# Patient Record
Sex: Male | Born: 1985 | Race: Black or African American | Hispanic: No | Marital: Single | Smoking: Never smoker
Health system: Southern US, Community
[De-identification: ages and names within clinical notes are randomized; demographics above are authoritative.]

## PROBLEM LIST (undated history)

## (undated) DIAGNOSIS — F909 Attention-deficit hyperactivity disorder, unspecified type: Secondary | ICD-10-CM

## (undated) DIAGNOSIS — M19071 Primary osteoarthritis, right ankle and foot: Secondary | ICD-10-CM

## (undated) DIAGNOSIS — IMO0002 Reserved for concepts with insufficient information to code with codable children: Secondary | ICD-10-CM

## (undated) DIAGNOSIS — R4689 Other symptoms and signs involving appearance and behavior: Secondary | ICD-10-CM

## (undated) DIAGNOSIS — M2140 Flat foot [pes planus] (acquired), unspecified foot: Secondary | ICD-10-CM

## (undated) HISTORY — DX: Other symptoms and signs involving appearance and behavior: R46.89

## (undated) HISTORY — DX: Reserved for concepts with insufficient information to code with codable children: IMO0002

## (undated) HISTORY — PX: OTHER SURGICAL HISTORY: SHX169

## (undated) HISTORY — DX: Attention-deficit hyperactivity disorder, unspecified type: F90.9

---
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
## 2005-01-17 ENCOUNTER — Emergency Department (HOSPITAL_COMMUNITY): Admission: EM | Admit: 2005-01-17 | Discharge: 2005-01-17 | Payer: Self-pay | Admitting: Emergency Medicine

## 2006-07-05 ENCOUNTER — Emergency Department (HOSPITAL_COMMUNITY): Admission: EM | Admit: 2006-07-05 | Discharge: 2006-07-05 | Payer: Self-pay | Admitting: *Deleted

## 2007-05-25 ENCOUNTER — Emergency Department (HOSPITAL_COMMUNITY): Admission: EM | Admit: 2007-05-25 | Discharge: 2007-05-25 | Payer: Self-pay | Admitting: General Surgery

## 2009-02-24 ENCOUNTER — Emergency Department (HOSPITAL_COMMUNITY): Admission: EM | Admit: 2009-02-24 | Discharge: 2009-02-24 | Payer: Self-pay | Admitting: Emergency Medicine

## 2013-11-25 ENCOUNTER — Emergency Department (INDEPENDENT_AMBULATORY_CARE_PROVIDER_SITE_OTHER)
Admission: EM | Admit: 2013-11-25 | Discharge: 2013-11-25 | Disposition: A | Discharge status: Home / Self Care | Payer: Medicaid Other | Source: Home / Self Care | Attending: Family Medicine | Admitting: Family Medicine

## 2013-11-25 ENCOUNTER — Emergency Department (INDEPENDENT_AMBULATORY_CARE_PROVIDER_SITE_OTHER): Payer: Medicaid Other

## 2013-11-25 ENCOUNTER — Encounter (HOSPITAL_COMMUNITY): Payer: Self-pay | Admitting: Emergency Medicine

## 2013-11-25 DIAGNOSIS — M79673 Pain in unspecified foot: Secondary | ICD-10-CM

## 2013-11-25 DIAGNOSIS — M79609 Pain in unspecified limb: Secondary | ICD-10-CM

## 2013-11-25 MED ORDER — DICLOFENAC SODIUM 75 MG PO TBEC: 75.0000 mg | DELAYED_RELEASE_TABLET | Freq: Two times a day (BID) | ORAL | Status: AC | PRN

## 2013-11-25 NOTE — ED Provider Notes (Signed)
Jose Woods is a 28 y.o. male who presents to Urgent Care today for right leg pain. Right leg pain is been present off and on for years. It is worsened over the past 2 days. He notes pain across the dorsal midfoot into the anterior ankle and in the anterior distal knee. Pain is worse with activity and better with rest. He denies any significant weakness numbness or radiating pain. He has a history of a congenital foot abnormality. Him and his mother not quite sure what the problem exactly was but did describe surgery on his Achilles tendon when he was 28 years old to lengthen it. He has had foot problems off and on since then.   History reviewed. No pertinent past medical history. History  Substance Use Topics  . Smoking status: Not on file  . Smokeless tobacco: Not on file  . Alcohol Use: Not on file   ROS as above Medications: No current facility-administered medications for this encounter.   Current Outpatient Prescriptions  Medication Sig Dispense Refill  . diclofenac (VOLTAREN) 75 MG EC tablet Take 1 tablet (75 mg total) by mouth 2 (two) times daily as needed.  60 tablet  0    Exam:  BP 128/68  Pulse 66  Temp(Src) 98.4 F (36.9 C) (Oral)  Resp 14  SpO2 100% Gen: Well NAD Right lower extremity:  Right knee is normal-appearing nontender stable ligamentous exam full range of motion and negative McMurray's test. Strength is intact Right foot: Flat rigid foot nontender. Capillary Refill sensation are intact. Right ankle: Decreased motion nontender. Stable ligament exam.  Right foot is visibly shorter than the left foot.  Leg length: right leg length is approximately 1 cm shorter than the left  No results found for this or any previous visit (from the past 24 hour(s)). Dg Ankle Complete Right  11/25/2013   CLINICAL DATA:  Pain  EXAM: RIGHT ANKLE - COMPLETE 3+ VIEW  COMPARISON:  DG FOOT COMPLETE*R* dated 11/25/2013; DG ANKLE COMPLETE*R* dated 01/17/2005  FINDINGS: There is no  evidence of fracture or dislocation. Severe arthritic changes of the ankle are again appreciated which appears stable. There is subtalar joint arthropathy, multiple areas of hypertrophic bone spurring. Areas of talonavicular and tarsal metatarsal hypertrophic spurring are identified. Stable flatfoot deformity.  IMPRESSION: Chronic arthritic changes without evidence of acute abnormalities. Stable flatfoot deformity.   Electronically Signed   By: Salome HolmesHector  Cooper M.D.   On: 11/25/2013 12:21   Dg Foot Complete Right  11/25/2013   CLINICAL DATA:  Foot pain, no injury  EXAM: RIGHT FOOT COMPLETE - 3+ VIEW  COMPARISON:  None.  FINDINGS: There is no evidence of fracture or dislocation. There is plantar calcaneal spur. There are degenerative joint changes of the first digit with narrowed joint space and osteophyte formation. Degenerative joint changes of the tarsal bones are noted. Soft tissues are unremarkable.  IMPRESSION: No acute fracture or dislocation. Degenerative joint changes of right foot.   Electronically Signed   By: Sherian ReinWei-Chen  Lin M.D.   On: 11/25/2013 12:05    Assessment and Plan: 28 y.o. male with right leg and foot pain. This is secondary to his congenital abnormality. 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 Lahey would benefit from an orthotic. Recommend he return to his primary care provider requests referral to podiatry for orthotic. Diclofenac for pain in the interim. Work note provided.  Discussed warning signs or symptoms. Please see discharge instructions. Patient expresses understanding.    Jose HarrowEvan S  Denyse Amass, MD 11/25/13 1250

## 2013-11-25 NOTE — ED Notes (Signed)
C/o right leg pain  See physician note

## 2013-11-25 NOTE — Discharge Instructions (Signed)
Thank you for coming in today. Take diclofenac twice daily. Followup with your primary care Dr. soon for referral to podiatry.

## 2014-06-30 ENCOUNTER — Ambulatory Visit: Payer: Self-pay | Admitting: Podiatry

## 2014-09-02 DIAGNOSIS — M2140 Flat foot [pes planus] (acquired), unspecified foot: Secondary | ICD-10-CM

## 2014-09-02 DIAGNOSIS — M2170 Unequal limb length (acquired), unspecified site: Secondary | ICD-10-CM

## 2014-09-02 DIAGNOSIS — M19071 Primary osteoarthritis, right ankle and foot: Secondary | ICD-10-CM

## 2014-09-02 HISTORY — DX: Unequal limb length (acquired), unspecified site: M21.70

## 2014-09-02 HISTORY — DX: Primary osteoarthritis, right ankle and foot: M19.071

## 2014-09-02 HISTORY — DX: Flat foot (pes planus) (acquired), unspecified foot: M21.40

## 2015-05-17 ENCOUNTER — Ambulatory Visit (INDEPENDENT_AMBULATORY_CARE_PROVIDER_SITE_OTHER): Payer: Medicaid Other

## 2015-05-17 ENCOUNTER — Ambulatory Visit (INDEPENDENT_AMBULATORY_CARE_PROVIDER_SITE_OTHER): Payer: Medicaid Other | Admitting: Podiatry

## 2015-05-17 ENCOUNTER — Encounter: Payer: Self-pay | Admitting: Podiatry

## 2015-05-17 VITALS — BP 101/86 | HR 72 | Resp 12

## 2015-05-17 DIAGNOSIS — R52 Pain, unspecified: Secondary | ICD-10-CM | POA: Diagnosis not present

## 2015-05-17 DIAGNOSIS — M19071 Primary osteoarthritis, right ankle and foot: Secondary | ICD-10-CM

## 2015-05-17 NOTE — Patient Instructions (Signed)
Today your x-ray examination and clinical examination demonstrated advanced wear and tear arthritis in the right rear foot area which is the primary source of pain. In addition the right leg appears shorter than the left which further aggravates the arthritis in the rear foot The left foot has mild osteoarthritis in the rear foot area An [place] brace which is a custom brace that molds to the lower leg and rear foot could reduce some of the discomfort.

## 2015-05-17 NOTE — Progress Notes (Signed)
Subjective:    Patient ID: Jose Woods, male    DOB: 1986-05-13, 29 y.o.   MRN: [id number]  HPI  PT REQUESTING FOR TOENAILS DEBRIDEMENT. This patient presents today complaining of pain in the dorsal aspect the right foot over a multiple year. The pain has increased in the last 3 months. The symptoms are activated primary with standing walking relieved with rest. Patient has tried wearing good athletic style shoes, however, the pain persisted. The patient's grandparents described surgical treatment to the tendo Achilles area 2 as a child. The patient's grandparents want to know if a shoe insert would be helpful for their grandsons foot pain. Also, there is a history of possible limb shortage in the right lower extremity. Patient is under the care of his grandparents. Also, patient was complaining of uncomfortable elongated toenails and requests toenail debridement.   Review of Systems  Psychiatric/Behavioral: Positive for behavioral problems.  All other systems reviewed and are negative.      Objective:   Physical Exam  Patient is responsive and does answer some questions with the assistance of his grandparents  Objective: Vascular: No peripheral edema noted bilaterally DP and PT pulses 2/4 bilaterally Capillary reflex immediate bilaterally  Neurological: Sensation to 10 g monofilament wire intact 5/5 bilaterally Knee and ankle reflex equal and reactive bilaterally  Dermatological: Texture and turgor within normal limits Well-healed surgical scar posterior tendo Achilles right  Musculoskeletal: Pes planus bilaterally Right calf appears smaller than left Right extremity approximately half inch shorter than left by observation Right subtalar joint has no motion, no restriction left subtalar joint motion Right midtarsal joint restricted, no restriction right midtarsal joint motion Dorsi flexion neutral bilaterally  x-ray examination weightbearing right foot, dated  05/17/2015  Intact bony structure without fracture and/or dislocation Pes planus Posterior and inferior calcaneal spurs Large dorsal exostosis talus at navicular area Deformity anterior ankle associated with the talus exostosis Decrease subtalar joint space Decreased joint space talus navicular Hallux interphalangeus  Radiographic impression: No acute bony abnormality noted Significant advanced osteoarthritis subtalar joint and midtarsal joint, right foot Osteoarthritic changes in the ankle joint  X-ray examination weightbearing left foot, dated 05/17/2015  Intact bony structure without fracture and/or dislocation Pes planus Posterior and inferior calcaneal spurs Small exostosis talus  Radiographic impression: No acute bony abnormality noted in the left foot Pes planus    Assessment & Plan:   Assessment: Limb shortage right lower extremity 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 I made him aware that there was significant osteoarthritic changes in the rear foot and ankle of the right foot which is the reason for right foot pain. I told him at this time because of the advanced osteoarthritis in the rear foot that the options could include a external brace, and [place] type brace to restrict ankle and subtalar joint motion with the benefit of reduction of pain. I made him aware that the osteoarthritis still would be present. If the brace did not work then possible surgical fusion of the ankle and rear foot was a possibility. Also, I would recommend a half to three-quarter inch full heel and sole left with the [place] brace. Patient and grandparents were made aware that Medicaid would not pay for such a brace. Patient's grandfather said that he would self-pay for this brace  Schedule appointment with her orthotist for  consultation and  fabrication of [place] brace, right lower extremity

## 2015-06-14 ENCOUNTER — Ambulatory Visit: Payer: Medicaid Other | Admitting: *Deleted

## 2015-06-14 DIAGNOSIS — M19071 Primary osteoarthritis, right ankle and foot: Secondary | ICD-10-CM

## 2015-06-14 NOTE — Progress Notes (Signed)
Patient ID: Jose Woods, male   DOB: 12/07/1985, 29 y.o.   MRN: [id number] [facility]

## 2015-06-25 IMAGING — CR DG ANKLE COMPLETE 3+V*R*
3 series · 3 of 3 positions shown · non-contrast
Comparison: DG FOOT COMPLETE*R* dated 11/25/2013; DG ANKLE
COMPLETE*R* dated 01/17/2005

CLINICAL DATA: Pain

EXAM:
RIGHT ANKLE - COMPLETE 3+ VIEW

[view not recorded (1 of 3)]
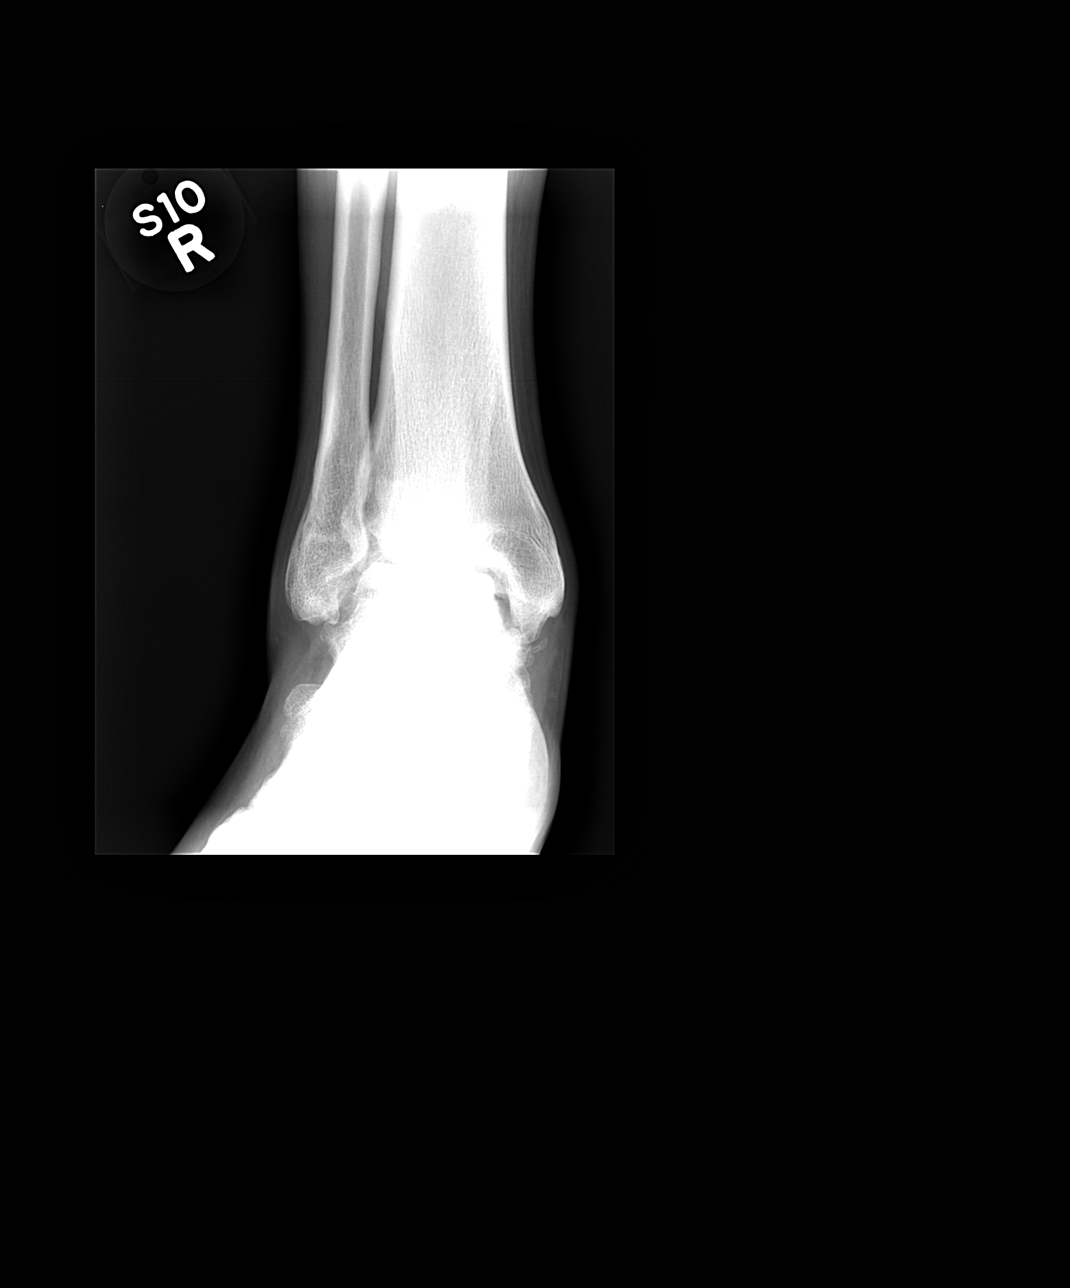

[view not recorded (2 of 3)]
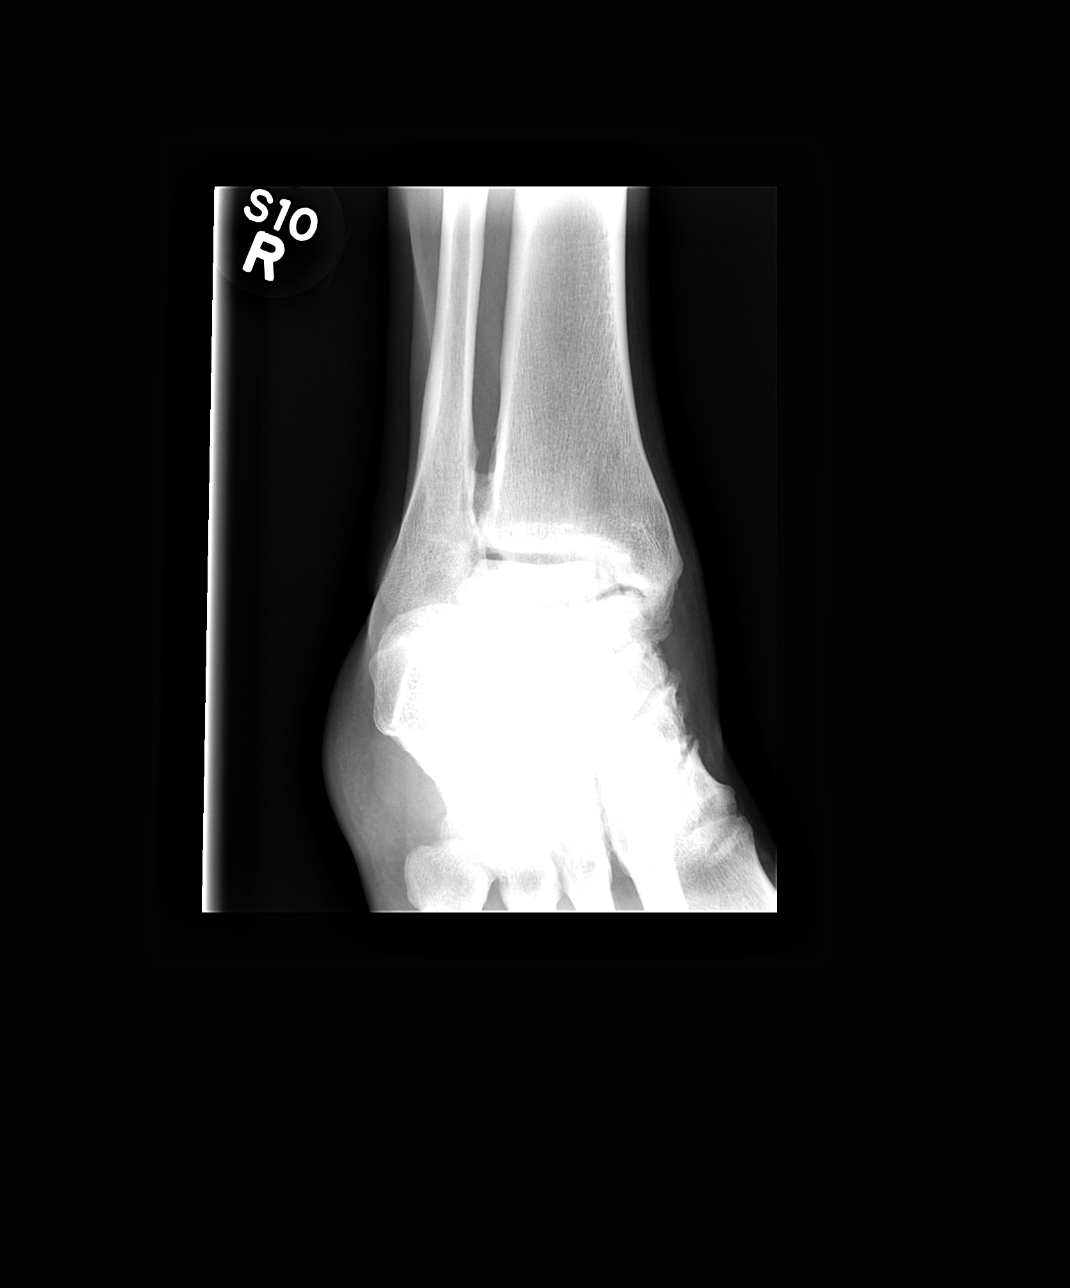

[view not recorded (3 of 3)]
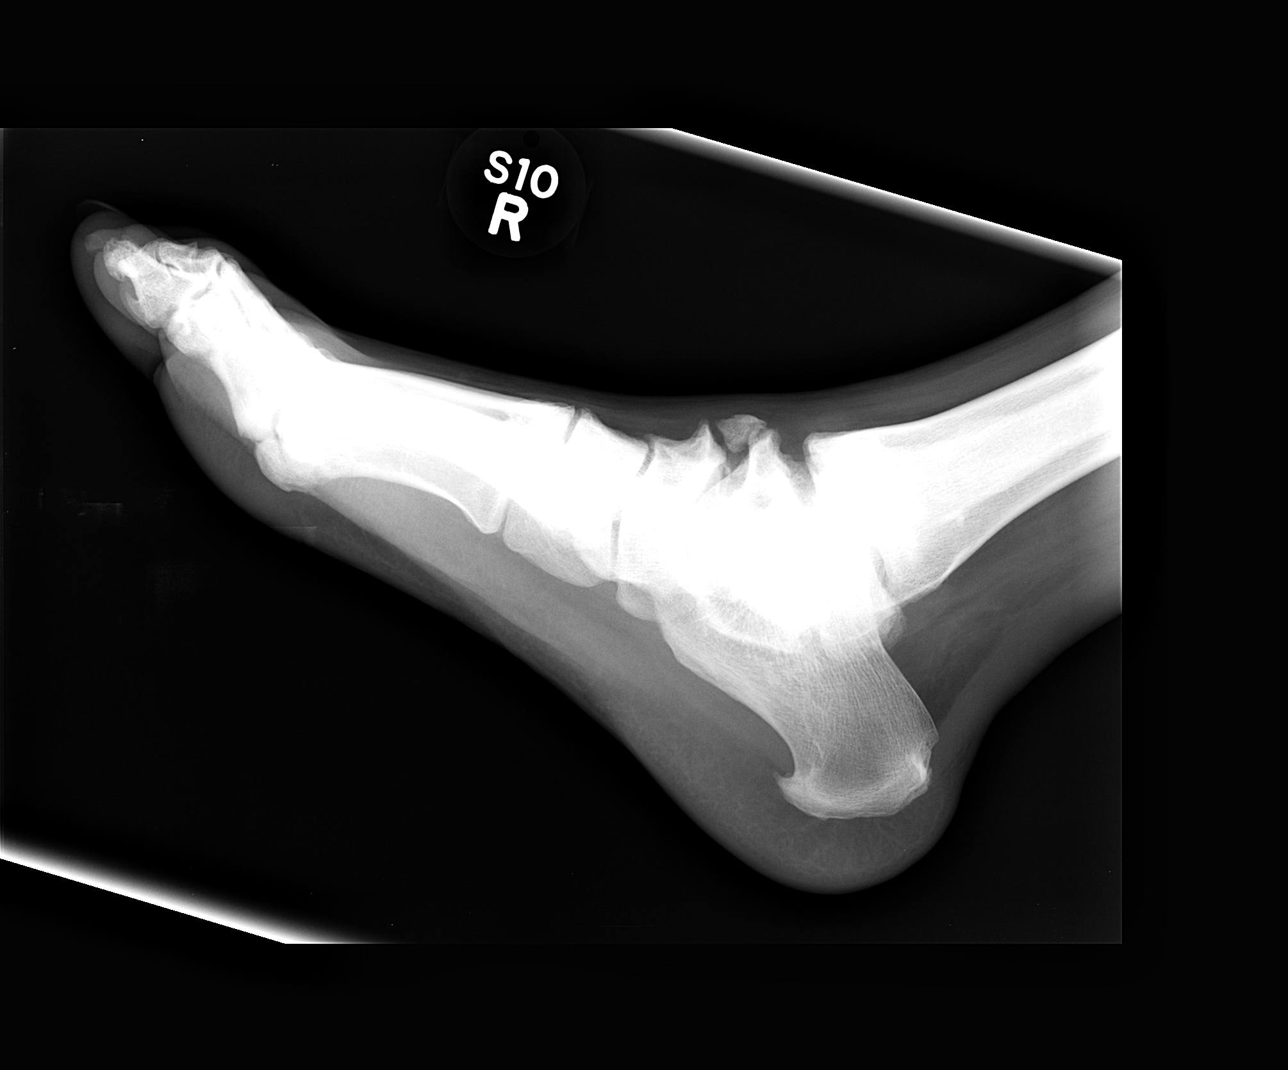

[3 of 3 positions shown; findings below may reference images not displayed]

FINDINGS: There is no evidence of fracture or dislocation. Severe arthritic
changes of the ankle are again appreciated which appears stable.
There is subtalar joint arthropathy, multiple areas of hypertrophic
bone spurring. Areas of talonavicular and tarsal metatarsal
hypertrophic spurring are identified. Stable flatfoot deformity.
IMPRESSION: Chronic arthritic changes without evidence of acute abnormalities.
Stable flatfoot deformity.

## 2015-07-12 ENCOUNTER — Ambulatory Visit (INDEPENDENT_AMBULATORY_CARE_PROVIDER_SITE_OTHER): Payer: Medicaid Other | Admitting: *Deleted

## 2015-07-12 DIAGNOSIS — M19071 Primary osteoarthritis, right ankle and foot: Secondary | ICD-10-CM

## 2015-07-12 NOTE — Progress Notes (Signed)
Patient ID: Jose Woods, male   DOB: 04-Sep-1985, 29 y.o.   MRN: [id number] Patient [facility] of [place] brace [facility]. Written and verbal break in instructions given. Patient will follow up in 6 weeks with Dr. Leeanne Deeduchman.

## 2015-07-22 DIAGNOSIS — M19071 Primary osteoarthritis, right ankle and foot: Secondary | ICD-10-CM

## 2015-07-22 DIAGNOSIS — M79671 Pain in right foot: Secondary | ICD-10-CM

## 2015-08-01 ENCOUNTER — Encounter: Payer: Self-pay | Admitting: Podiatry

## 2015-08-01 ENCOUNTER — Ambulatory Visit (INDEPENDENT_AMBULATORY_CARE_PROVIDER_SITE_OTHER): Payer: Medicaid Other | Admitting: Podiatry

## 2015-08-01 VITALS — BP 152/86 | HR 69 | Resp 12

## 2015-08-01 DIAGNOSIS — M19071 Primary osteoarthritis, right ankle and foot: Secondary | ICD-10-CM | POA: Diagnosis not present

## 2015-08-01 DIAGNOSIS — R52 Pain, unspecified: Secondary | ICD-10-CM

## 2015-08-01 NOTE — Progress Notes (Signed)
   Subjective:    Patient ID: Jose Woods, male    DOB: February 02, 1986, 29 y.o.   MRN: [id number]  HPI This patient presents today with his grandmother for follow-up tolerance of [place] brace worn on the right foot. Patient states that his right foot feels considerably better when he wears [place] brace, however, when he removes the brace she complains of some discomfort in the right foot. He denies any skin sores from the brace and overall has had relief of symptoms   Review of Systems  Musculoskeletal: Positive for joint swelling and gait problem.       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

## 2015-08-01 NOTE — Patient Instructions (Addendum)
Today you stated that the [place] brace worn on the right foot make sure foot feel comfortable when you were the brace and uncomfortable 1 you remove the brace Increase the wearing time even at night and have a slipper that you can cover the brace and when you go to the bathroom Remove the brace at least once a day and massage skin lotion in your feet and inspect for any sign of irritation

## 2015-08-23 ENCOUNTER — Ambulatory Visit (INDEPENDENT_AMBULATORY_CARE_PROVIDER_SITE_OTHER): Payer: Medicaid Other | Admitting: Podiatry

## 2015-08-23 NOTE — Progress Notes (Signed)
Patient ID: Jose Woods, male   DOB: Jan 16, 1986, 29 y.o.   MRN: [id number]    NO SHOW

## 2015-12-15 ENCOUNTER — Emergency Department
Admission: EM | Admit: 2015-12-15 | Discharge: 2015-12-15 | Disposition: A | Discharge status: Home / Self Care | Payer: Medicaid Other | Attending: Emergency Medicine | Admitting: Emergency Medicine

## 2015-12-15 ENCOUNTER — Encounter: Payer: Self-pay | Admitting: Emergency Medicine

## 2015-12-15 DIAGNOSIS — H5711 Ocular pain, right eye: Secondary | ICD-10-CM | POA: Diagnosis present

## 2015-12-15 DIAGNOSIS — F909 Attention-deficit hyperactivity disorder, unspecified type: Secondary | ICD-10-CM | POA: Diagnosis not present

## 2015-12-15 DIAGNOSIS — H18891 Other specified disorders of cornea, right eye: Secondary | ICD-10-CM

## 2015-12-15 DIAGNOSIS — H578 Other specified disorders of eye and adnexa: Secondary | ICD-10-CM | POA: Diagnosis not present

## 2015-12-15 MED ORDER — FLUORESCEIN SODIUM 1 MG OP STRP
1.0000 | ORAL_STRIP | Freq: Once | OPHTHALMIC | Status: DC
  Filled 2015-12-15: qty 1

## 2015-12-15 MED ORDER — TETRACAINE HCL 0.5 % OP SOLN
2.0000 [drp] | Freq: Once | OPHTHALMIC | Status: DC
  Filled 2015-12-15: qty 2

## 2015-12-15 MED ORDER — KETOROLAC TROMETHAMINE 0.5 % OP SOLN: 1.0000 [drp] | Freq: Four times a day (QID) | OPHTHALMIC | Status: AC
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
## 2015-12-15 NOTE — ED Notes (Signed)
Visual acuity screening performed by Susa RaringLisa RN. Screening was normal

## 2015-12-15 NOTE — Discharge Instructions (Signed)
How to Use Eye Drops and Eye Ointments  HOW TO APPLY EYE DROPS  Follow these steps when applying eye drops:  1. Wash your hands.  2. Tilt your head back.  3. Put a finger under your eye and use it to gently pull your lower lid downward. Keep that finger in place.  4. Using your other hand, hold the dropper between your thumb and index finger.  5. Position the dropper just over the edge of the lower lid. Hold it as close to your eye as you can without touching the dropper to your eye.  6. Steady your hand. One way to do this is to lean your index finger against your brow.  7. Look up.  8. Slowly and gently squeeze one drop of medicine into your eye.  9. Close your eye.  10. Place a finger between your lower eyelid and your nose. Press gently for 2 minutes. This increases the amount of time that the medicine is exposed to the eye. It also reduces side effects that can develop if the drop gets into the bloodstream through the nose.  HOW TO APPLY EYE OINTMENTS  Follow these steps when applying eye ointments:  1. Wash your hands.  2. Put a finger under your eye and use it to gently pull your lower lid downward. Keep that finger in place.  3. Using your other hand, place the tip of the tube between your thumb and index finger with the remaining fingers braced against your cheek or nose.  4. Hold the tube just over the edge of your lower lid without touching the tube to your lid or eyeball.  5. Look up.  6. Line the inner part of your lower lid with ointment.  7. Gently pull up on your upper lid and look down. This will force the ointment to spread over the surface of the eye.  8. Release the upper lid.  9. If you can, close your eyes for 1-2 minutes.  Do not rub your eyes. If you applied the ointment correctly, your vision will be blurry for a few minutes. This is normal.  ADDITIONAL INFORMATION   Make sure to use the eye drops or ointment as told by your health care provider.   If you have been told to use both eye  drops and an eye ointment, apply the eye drops first, then wait 3-4 minutes before you apply the ointment.   Try not to touch the tip of the dropper or tube to your eye. A dropper or tube that has touched the eye can become contaminated.     This information is not intended to replace advice given to you by your health care provider. Make sure you discuss any questions you have with your health care provider.     Document Released: 11/25/2000 Document Revised: 01/03/2015 Document Reviewed: 08/15/2014  Elsevier Interactive Patient Education 2016 [employer]

## 2015-12-15 NOTE — ED Notes (Signed)
  Reviewed d/c instructions, follow-up care, and prescriptions with pt. Pt verbalized understanding 

## 2017-03-19 ENCOUNTER — Ambulatory Visit: Payer: Medicaid Other

## 2017-03-19 ENCOUNTER — Ambulatory Visit (INDEPENDENT_AMBULATORY_CARE_PROVIDER_SITE_OTHER): Payer: Medicaid Other | Admitting: Podiatry

## 2017-03-19 DIAGNOSIS — L600 Ingrowing nail: Secondary | ICD-10-CM

## 2017-03-19 NOTE — Patient Instructions (Signed)

## 2017-03-19 NOTE — Progress Notes (Signed)
   Subjective: Patient presents today for evaluation of pain in toe(s). Patient is concerned for possible ingrown nail. Patient states that the pain has been present for a few weeks now. Patient presents today for further treatment and evaluation.  Objective:  General: Well developed, nourished, in no acute distress, alert and oriented x3   Dermatology: Skin is warm, dry and supple bilateral. Lateral border of the right great toe appears to be erythematous with evidence of an ingrowing nail. Pain on palpation noted to the border of the nail fold. The remaining nails appear unremarkable at this time. There are no open sores, lesions.  Vascular: Dorsalis Pedis artery and Posterior Tibial artery pedal pulses palpable. No lower extremity edema noted.   Neruologic: Grossly intact via light touch bilateral.  Musculoskeletal: Muscular strength within normal limits in all groups bilateral. Normal range of motion noted to all pedal and ankle joints.   Assesement: #1 Paronychia with ingrowing nail lateral border right great toe #2 Pain in toe #3 Incurvated nail  Plan of Care:  1. Patient evaluated.  2. Discussed treatment alternatives and plan of care. Explained nail avulsion procedure and post procedure course to patient. 3. Patient opted for permanent partial nail avulsion.  4. Prior to procedure, local anesthesia infiltration utilized using 3 ml of a 50:50 mixture of 2% plain lidocaine and 0.5% plain marcaine in a normal hallux block fashion and a betadine prep performed.  5. Partial permanent nail avulsion with chemical matrixectomy performed using 3x30sec applications of phenol followed by alcohol flush.  6. Light dressing applied. 7. Return to clinic in 2 weeks.   Brent M. Evans, DPM [facility]  Dr. Brent M. Evans, DPM    [address]                                        [place], [place] [address]                Office [phone]  Fax [phone]      

## 2018-02-27 ENCOUNTER — Encounter: Payer: Self-pay | Admitting: Podiatry

## 2018-02-27 ENCOUNTER — Ambulatory Visit: Payer: Medicaid Other | Admitting: Podiatry

## 2018-02-27 DIAGNOSIS — M79674 Pain in right toe(s): Secondary | ICD-10-CM

## 2018-02-27 DIAGNOSIS — M79675 Pain in left toe(s): Secondary | ICD-10-CM

## 2018-02-27 DIAGNOSIS — B351 Tinea unguium: Secondary | ICD-10-CM

## 2018-02-28 NOTE — Progress Notes (Signed)
Subjective: Mr. Jose Woods is a 32 y.o. AAM who presents  today with cc of painful, discolored, thick toenails which interfere with activities of daily living. Pain is aggravated when wearing enclosed shoe gear. Pain is getting progressively worse and relieved with periodic professional debridement.  Objective: There were no vitals filed for this visit. Vascular Examination: Capillary refill time <3 seconds x 10 digits Dorsalis pedis and posterior tibial pulses present b/l Sparse digital hair x 10 digits Skin temperature warm to warm b/l  Dermatological Examination: Skin thin and atrophic b/l Toenails 1-5 b/l discolored, thick, dystrophic with subungual debris and pain with palpation to nailbeds due to thickness of nails.  Musculoskeletal: Muscle strength 5/5 to all LE muscle groups  Neurological: Sensation intact with 10 gram monofilament. Vibratory sensation intact.  Assessment: Painful onychomycosis toenails 1-5 b/l   Plan: 1. Toenails 1-5 b/l were debrided in length and girth without iatrogenic bleeding. 2. Patient to continue soft, supportive shoe gear 3. Patient to report any pedal injuries to medical professional immediately. 4. Follow up 4 months.  5. Patient/POA to call should there be a concern in the interim.

## 2018-06-26 ENCOUNTER — Ambulatory Visit: Payer: Medicaid Other | Admitting: Podiatry

## 2018-09-18 ENCOUNTER — Encounter: Payer: Self-pay | Admitting: Podiatry

## 2018-09-18 ENCOUNTER — Ambulatory Visit (INDEPENDENT_AMBULATORY_CARE_PROVIDER_SITE_OTHER): Payer: Medicaid Other | Admitting: Podiatry

## 2018-09-18 DIAGNOSIS — B351 Tinea unguium: Secondary | ICD-10-CM | POA: Diagnosis not present

## 2018-09-18 DIAGNOSIS — M79675 Pain in left toe(s): Secondary | ICD-10-CM

## 2018-09-18 DIAGNOSIS — M79674 Pain in right toe(s): Secondary | ICD-10-CM

## 2018-09-18 NOTE — Patient Instructions (Signed)
BRING IN AFO!!!!!! Onychomycosis/Fungal Toenails  WHAT IS IT? An infection that lies within the keratin of your nail plate that is caused by a fungus.  WHY ME? Fungal infections affect all ages, sexes, races, and creeds.  There may be many factors that predispose you to a fungal infection such as age, coexisting medical conditions such as diabetes, or an autoimmune disease; stress, medications, fatigue, genetics, etc.  Bottom line: fungus thrives in a warm, moist environment and your shoes offer such a location.  IS IT CONTAGIOUS? Theoretically, yes.  You do not want to share shoes, nail clippers or files with someone who has fungal toenails.  Walking around barefoot in the same room or sleeping in the same bed is unlikely to transfer the organism.  It is important to realize, however, that fungus can spread easily from one nail to the next on the same foot.  HOW DO WE TREAT THIS?  There are several ways to treat this condition.  Treatment may depend on many factors such as age, medications, pregnancy, liver and kidney conditions, etc.  It is best to ask your doctor which options are available to you.  1. No treatment.   Unlike many other medical concerns, you can live with this condition.  However for many people this can be a painful condition and may lead to ingrown toenails or a bacterial infection.  It is recommended that you keep the nails cut short to help reduce the amount of fungal nail. 2. Topical treatment.  These range from herbal remedies to prescription strength nail lacquers.  About 40-50% effective, topicals require twice daily application for approximately 9 to 12 months or until an entirely new nail has grown out.  The most effective topicals are medical grade medications available through physicians offices. 3. Oral antifungal medications.  With an 80-90% cure rate, the most common oral medication requires 3 to 4 months of therapy and stays in your system for a year as the new  nail grows out.  Oral antifungal medications do require blood work to make sure it is a safe drug for you.  A liver function panel will be performed prior to starting the medication and after the first month of treatment.  It is important to have the blood work performed to avoid any harmful side effects.  In general, this medication safe but blood work is required. 4. Laser Therapy.  This treatment is performed by applying a specialized laser to the affected nail plate.  This therapy is noninvasive, fast, and non-painful.  It is not covered by insurance and is therefore, out of pocket.  The results have been very good with a 80-95% cure rate.  The [facility] is the only practice in the area to offer this therapy. 5. Permanent Nail Avulsion.  Removing the entire nail so that a new nail will not grow back.

## 2018-10-04 ENCOUNTER — Encounter: Payer: Self-pay | Admitting: Podiatry

## 2018-10-04 NOTE — Progress Notes (Signed)
Subjective: Jose Woods presents today with painful, thick toenails 1-5 b/l that he cannot cut and which interfere with daily activities.  Pain is aggravated when wearing enclosed shoe gear.  His grandmother is present during the visit. Jose Woods states he needs his AFO assessed. He does not have it with him on today.  [name], MD is his PCP.   Current Outpatient Medications:  .  Amphetamine-Dextroamphetamine (ADDERALL PO), Take by mouth., Disp: , Rfl:  .  diclofenac (VOLTAREN) 75 MG EC tablet, Take 1 tablet (75 mg total) by mouth 2 (two) times daily as needed., Disp: 60 tablet, Rfl: 0 .  ketorolac (ACULAR) 0.5 % ophthalmic solution, Place 1 drop into the right eye 4 (four) times daily., Disp: 5 mL, Rfl: 0  No Known Allergies  Objective:  Vascular Examination: Capillary refill time <3 seconds x 10 digits  Dorsalis pedis and Posterior tibial pulses palpable b/l  Digital hair present x 10 digits  Skin temperature gradient WNL b/l  Dermatological Examination: Skin thin and atrophic b/l  Toenails 1-5 b/l discolored, thick, dystrophic with subungual debris and pain with palpation to nailbeds due to thickness of nails.  Musculoskeletal: Muscle strength 5/5 to all LE muscle groups  No gross bony deformities b/l.  No pain, crepitus or joint limitation noted with ROM.   Neurological: Sensation intact with 10 gram monofilament. Vibratory sensation intact.  Assessment: Painful onychomycosis toenails 1-5 b/l   Plan: 1. Toenails 1-5 b/l were debrided in length and girth without iatrogenic bleeding. 2. Patient to continue soft, supportive shoe gear 3. Patient to report any pedal injuries to medical professional immediately. 4. Follow up 3 months. Patient/POA to call should there be a concern in the interim.

## 2018-11-02 ENCOUNTER — Encounter: Payer: Self-pay | Admitting: Podiatry

## 2018-12-18 ENCOUNTER — Ambulatory Visit: Payer: Medicaid Other | Admitting: Podiatry

## 2019-01-20 ENCOUNTER — Ambulatory Visit: Payer: Medicaid Other | Admitting: Podiatry

## 2019-01-22 ENCOUNTER — Encounter: Payer: Self-pay | Admitting: Podiatry

## 2019-01-22 ENCOUNTER — Ambulatory Visit: Payer: Medicaid Other | Admitting: Podiatry

## 2019-01-22 ENCOUNTER — Other Ambulatory Visit: Payer: Self-pay

## 2019-01-22 VITALS — Temp 97.9°F

## 2019-01-22 DIAGNOSIS — M79674 Pain in right toe(s): Secondary | ICD-10-CM | POA: Diagnosis not present

## 2019-01-22 DIAGNOSIS — B351 Tinea unguium: Secondary | ICD-10-CM

## 2019-01-22 DIAGNOSIS — M19071 Primary osteoarthritis, right ankle and foot: Secondary | ICD-10-CM

## 2019-01-22 DIAGNOSIS — M2142 Flat foot [pes planus] (acquired), left foot: Secondary | ICD-10-CM

## 2019-01-22 DIAGNOSIS — M79675 Pain in left toe(s): Secondary | ICD-10-CM

## 2019-01-22 DIAGNOSIS — M2141 Flat foot [pes planus] (acquired), right foot: Secondary | ICD-10-CM

## 2019-01-22 DIAGNOSIS — M2170 Unequal limb length (acquired), unspecified site: Secondary | ICD-10-CM

## 2019-01-22 NOTE — Patient Instructions (Signed)

## 2019-01-31 ENCOUNTER — Encounter: Payer: Self-pay | Admitting: Podiatry

## 2019-01-31 DIAGNOSIS — M19071 Primary osteoarthritis, right ankle and foot: Secondary | ICD-10-CM | POA: Insufficient documentation

## 2019-01-31 DIAGNOSIS — B351 Tinea unguium: Secondary | ICD-10-CM | POA: Insufficient documentation

## 2019-01-31 DIAGNOSIS — M79675 Pain in left toe(s): Secondary | ICD-10-CM | POA: Insufficient documentation

## 2019-01-31 DIAGNOSIS — M2170 Unequal limb length (acquired), unspecified site: Secondary | ICD-10-CM | POA: Insufficient documentation

## 2019-01-31 DIAGNOSIS — M2140 Flat foot [pes planus] (acquired), unspecified foot: Secondary | ICD-10-CM | POA: Insufficient documentation

## 2019-01-31 NOTE — Progress Notes (Signed)
Subjective:  Jose Woods presents to clinic today with cc of  painful, thick, discolored, elongated toenails 1-5 b/l that become tender and cannot cut because of thickness.  Patient's grandmother is his caregiver.  He has h/o surgery to Achilles tendon twice as a [place].  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  He has an [place] which is   Pain is aggravated when wearing enclosed shoe gear.  Jose Contras, MD is his PCP.    Current Outpatient Medications:  .  Amphetamine-Dextroamphetamine (ADDERALL PO), Take by mouth., Disp: , Rfl:  .  diclofenac (VOLTAREN) 75 MG EC tablet, Take 1 tablet (75 mg total) by mouth 2 (two) times daily as needed., Disp: 60 tablet, Rfl: 0 .  ketorolac (ACULAR) 0.5 % ophthalmic solution, Place 1 drop into the right eye 4 (four) times daily., Disp: 5 mL, Rfl: 0   No Known Allergies   Objective: Vitals:   01/22/19 0745  Temp: 97.9 F (36.6 C)    Physical Examination:  Vascular Examination: Capillary refill time <3 seconds x 10 digits.  Palpable DP/PT pulses b/l.  Digital hair present b/l.  No edema noted b/l.  Skin temperature gradient WNL b/l.  Dermatological Examination: Skin thin and atrophic b/l.  No open wounds b/l.  No interdigital macerations noted b/l.  Elongated, thick, discolored brittle toenails with subungual debris and pain on dorsal palpation of nailbeds 1-5 b/l.  Musculoskeletal Examination: Muscle strength 5/5 to all muscle groups b/l.  Pes planus foot deformity b/l.  Muscle wasting right calf.  RLE 1/2 inch shorter than LLE  STJ with no motion RLE. Normal ROM LLE.  MTJ restricted RLE and WNL LLE.  Hammertoes RLE  Neurological Examination: Sensation intact 5/5 b/l with 10 gram monofilament.  Vibratory sensation intact b/l.  Proprioceptive sensation intact b/l.  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 Toenails 1-5 b/l were debrided in length and girth without iatrogenic laceration. 2. Rx written for [facility] for new [facility] for diagnoses of pes planus, LLD right LE (shorter), and OA of right foot/ankle. 3. Continue soft, supportive shoe gear daily. 4. Report any pedal injuries to medical professional. 5. Follow up 3 months. 6. Patient/POA to call should there be a question/concern in there interim.

## 2019-02-12 ENCOUNTER — Telehealth: Payer: Self-pay | Admitting: Podiatry

## 2019-02-12 NOTE — Telephone Encounter (Signed)
Shameeka with [facility] called. The fax sent over on Mr. Heindl, she needs the sheet that at the top says [place] for prior approval to be re faxed as it came through distorted. Can be faxed to her at [phone].

## 2019-02-15 NOTE — Telephone Encounter (Signed)
Faxed note informing [place] the [place] form was received in our office distorted and if fax again to our office clearly, I would have it signed.

## 2019-04-26 ENCOUNTER — Encounter: Payer: Self-pay | Admitting: Podiatry

## 2019-04-26 ENCOUNTER — Other Ambulatory Visit: Payer: Self-pay

## 2019-04-26 ENCOUNTER — Ambulatory Visit (INDEPENDENT_AMBULATORY_CARE_PROVIDER_SITE_OTHER): Payer: Medicaid Other | Admitting: Podiatry

## 2019-04-26 VITALS — Temp 98.0°F

## 2019-04-26 DIAGNOSIS — M79674 Pain in right toe(s): Secondary | ICD-10-CM | POA: Diagnosis not present

## 2019-04-26 DIAGNOSIS — M79675 Pain in left toe(s): Secondary | ICD-10-CM

## 2019-04-26 DIAGNOSIS — B351 Tinea unguium: Secondary | ICD-10-CM | POA: Diagnosis not present

## 2019-04-26 NOTE — Patient Instructions (Signed)

## 2019-05-03 NOTE — Progress Notes (Signed)
Subjective:  Jose Woods presents to clinic today with cc of  painful, thick, discolored, elongated toenails 1-5 b/l that become tender and cannot cut because of thickness.  Pain is aggravated when wearing enclosed shoe gear.  Patient states that he received his new brace from [place] [facility].   Current Outpatient Medications:  .  Amphetamine-Dextroamphetamine (ADDERALL PO), Take by mouth., Disp: , Rfl:  .  diclofenac (VOLTAREN) 75 MG EC tablet, Take 1 tablet (75 mg total) by mouth 2 (two) times daily as needed., Disp: 60 tablet, Rfl: 0 .  ketorolac (ACULAR) 0.5 % ophthalmic solution, Place 1 drop into the right eye 4 (four) times daily., Disp: 5 mL, Rfl: 0   No Known Allergies   Objective: Vitals:   04/26/19 0919  Temp: 46 F (36.7 C)    Physical Examination:  Vascular Examination: Capillary refill time less than 3 seconds x 10 digits.    Palpable DP/PT pulses b/l.  Digital hair present b/l.  No edema noted b/l.  Skin temperature gradient WNL b/l.  Dermatological Examination: Skin thin and atrophic bilaterally. No open wounds b/l.  No interdigital macerations noted b/l.  Elongated, thick, discolored brittle toenails with subungual debris and pain on dorsal palpation of nailbeds 1-5 b/l.  Musculoskeletal Examination: Muscle strength 5/5 to all muscle groups b/l.  Pes planus foot deformity bilaterally.  Muscle wasting right calf.  Limb length discrepancy right lower extremity 1/2 inch shorter than the left lower extremity.  Rigid subtalar joint right lower extremity.  Rigid midtarsal joint right lower extremity.  Hammertoes noted right lower extremity. No pain, crepitus or joint discomfort with active/passive ROM.  Neurological Examination: Sensation intact 5/5 b/l with 10 gram monofilament.  Vibratory sensation intact b/l.  Proprioceptive sensation intact b/l.  Assessment: Mycotic nail infection with pain 1-5 b/l  Plan: 1. Toenails 1-5 b/l were  debrided in length and girth without iatrogenic laceration. 2.  Continue soft, supportive shoe gear daily. 3.  Report any pedal injuries to medical professional. 4.  Follow up 3 months. 5.  Patient/POA to call should there be a question/concern in there interim.

## 2019-07-24 ENCOUNTER — Ambulatory Visit (HOSPITAL_COMMUNITY)
Admission: EM | Admit: 2019-07-24 | Discharge: 2019-07-24 | Disposition: A | Discharge status: Home / Self Care | Payer: Medicaid Other | Attending: Family Medicine | Admitting: Family Medicine

## 2019-07-24 ENCOUNTER — Encounter (HOSPITAL_COMMUNITY): Payer: Self-pay | Admitting: Emergency Medicine

## 2019-07-24 ENCOUNTER — Ambulatory Visit (INDEPENDENT_AMBULATORY_CARE_PROVIDER_SITE_OTHER): Payer: Medicaid Other

## 2019-07-24 ENCOUNTER — Ambulatory Visit (HOSPITAL_COMMUNITY): Payer: Medicaid Other

## 2019-07-24 ENCOUNTER — Other Ambulatory Visit: Payer: Self-pay

## 2019-07-24 DIAGNOSIS — L03012 Cellulitis of left finger: Secondary | ICD-10-CM

## 2019-07-24 MED ORDER — LIDOCAINE HCL (PF) 2 % IJ SOLN
INTRAMUSCULAR | Status: AC
  Filled 2019-07-24: qty 5

## 2019-07-24 MED ORDER — CEPHALEXIN 500 MG PO CAPS: 500.0000 mg | ORAL_CAPSULE | Freq: Two times a day (BID) | ORAL | 0 refills | Status: DC

## 2019-07-24 NOTE — ED Triage Notes (Signed)
Patient reports dropping something on left thumb, 2 days ago.  Now, left thumb is painful, swollen.  Redness present around nailbed.

## 2019-07-24 NOTE — Discharge Instructions (Signed)
Please keep the area clean  Please follow up if your symptoms don't improve

## 2019-07-24 NOTE — ED Provider Notes (Signed)
[facility]    CSN: [id number] Arrival date & time: 07/24/19  1004      History   Chief Complaint Chief Complaint  Patient presents with  . Finger Injury    HPI Jose Woods is a 33 y.o. male.   He is presenting with a 2-day history of this left thumb pain.  The pain is occurring over the distal left thumb.  It is occurring near the nailbed.  There is some swelling and throbbing in this area.  There is some redness.  He denies smashing it.  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  Has not taken or done anything for it.  It seems to be constant and severe.  HPI  Past Medical History:  Diagnosis Date  . ADHD (attention deficit hyperactivity disorder)   . Arthritis of right subtalar joint 2016  . Behavior problem   . Flat foot 2016  . Lower limb length difference 2016    Patient Active Problem List   Diagnosis Date Noted  . Lower limb length difference 01/31/2019  . Arthritis of right subtalar joint 01/31/2019  . Flat foot 01/31/2019  . Pain due to onychomycosis of toenails of both feet 01/31/2019    Past Surgical History:  Procedure Laterality Date  . RT FOOT SURGERY         Home Medications    Prior to Admission medications   Medication Sig Start Date End Date Taking? Authorizing Provider  Amphetamine-Dextroamphetamine (ADDERALL PO) Take by mouth.    [provider]  cephALEXin (KEFLEX) 500 MG capsule Take 1 capsule (500 mg total) by mouth 2 (two) times daily. 07/24/19   Myra Rude, MD  diclofenac (VOLTAREN) 75 MG EC tablet Take 1 tablet (75 mg total) by mouth 2 (two) times daily as needed. 11/25/13   Rodolph Bong, MD  ketorolac (ACULAR) 0.5 % ophthalmic solution Place 1 drop into the right eye 4 (four) times daily. 12/15/15   Cuthriell, Delorise Royals, PA-C    Family History History reviewed. No pertinent family history.  Social History Social History   Tobacco Use  . Smoking status:  Never Smoker  . Smokeless tobacco: Never Used  Substance Use Topics  . Alcohol use: No  . Drug use: No     Allergies   Patient has no known allergies.   Review of Systems Review of Systems  Constitutional: Negative for fever.  HENT: Negative for congestion.   Respiratory: Negative for cough.   Cardiovascular: Negative for chest pain.  Gastrointestinal: Negative for abdominal pain.  Musculoskeletal: Negative for back pain.  Skin: Positive for color change.  Neurological: Negative for weakness.  Hematological: Negative for adenopathy.     Physical Exam Triage Vital Signs ED Triage Vitals [07/24/19 1027]  Enc Vitals Group     BP      Pulse      Resp      Temp      Temp src      SpO2      Weight      Height      Head Circumference      Peak Flow      Pain Score 10     Pain Loc      Pain Edu?      Excl. in GC?    No data found.  Updated Vital Signs BP (!) 141/72 (BP Location: Right Arm)   Pulse 63  Temp 98.4 F (36.9 C) (Oral)   Resp 18   SpO2 97%   Visual Acuity Right Eye Distance:   Left Eye Distance:   Bilateral Distance:    Right Eye Near:   Left Eye Near:    Bilateral Near:     Physical Exam Gen: NAD, alert, cooperative with exam, well-appearing ENT: normal lips, normal nasal mucosa,  Eye: normal EOM, normal conjunctiva and lids CV:  no edema, +2 pedal pulses   Resp: no accessory muscle use, non-labored,  GI: no masses or tenderness, no hernia  Skin: no rashes, no areas of induration  Neuro: normal tone, normal sensation to touch Psych:  normal insight, alert and oriented MSK:  Left thumb: Fluctuance appreciated near the nailbed. Normal thumb flexion extension. Some redness over this area of fluctuance. Normal wrist range of motion. No streaking. Neurovascularly intact   UC Treatments / Results  Labs (all labs ordered are listed, but only abnormal results are displayed) Labs Reviewed  AEROBIC CULTURE (SUPERFICIAL SPECIMEN)     EKG   Radiology Dg Finger Thumb Left  Result Date: 07/24/2019 CLINICAL DATA:  Crush injury.  Pain and swelling. EXAM: LEFT THUMB 2+V COMPARISON:  None. FINDINGS: There is no evidence of fracture or dislocation. There is no evidence of arthropathy or other focal bone abnormality. Soft tissue swelling noted in the thumb. IMPRESSION: No acute bony abnormality. Electronically Signed   By: Misty Stanley M.D.   On: 07/24/2019 11:18    Procedures Procedures (including critical care time)  Incision and Drainage Procedure Note:  The affected area was cleaned and draped in a sterile fashion. Anesthesia was achieved using 8 mL of 2% Lidocaine without epinephrine injected around the wound area using a 25-guage 1.5 inch needle. An 11-blade scalpel was used to incise the wound. A culture was obtained.  A sterile dressing was applied to the area. The patient tolerated the procedure well. No complications were encountered.    Medications Ordered in UC Medications  lidocaine (XYLOCAINE) 2 % injection (has no administration in time range)    Initial Impression / Assessment and Plan / UC Course  I have reviewed the triage vital signs and the nursing notes.  Pertinent labs & imaging results that were available during my care of the patient were reviewed by me and considered in my medical decision making (see chart for details).     Roderic Palau is a 33 year old male is presenting with left thumb pain.  Symptoms consistent with paronychia.  X-ray was not demonstrating a foreign body.  The area was incised and drained.  A wound culture was obtained.  He was also provided Keflex.  Counseled on supportive care.  Give indications to return.  Final Clinical Impressions(s) / UC Diagnoses   Final diagnoses:  Paronychia of left thumb     Discharge Instructions     Please keep the area clean  Please follow up if your symptoms don't improve     ED Prescriptions    Medication Sig Dispense Auth. Provider    cephALEXin (KEFLEX) 500 MG capsule Take 1 capsule (500 mg total) by mouth 2 (two) times daily. 14 capsule Rosemarie Ax, MD     PDMP not reviewed this encounter.   Rosemarie Ax, MD 07/24/19 [phone]

## 2019-07-26 ENCOUNTER — Encounter: Payer: Self-pay | Admitting: Podiatry

## 2019-07-26 ENCOUNTER — Other Ambulatory Visit: Payer: Self-pay

## 2019-07-26 ENCOUNTER — Ambulatory Visit (INDEPENDENT_AMBULATORY_CARE_PROVIDER_SITE_OTHER): Payer: Medicaid Other | Admitting: Podiatry

## 2019-07-26 ENCOUNTER — Telehealth (HOSPITAL_COMMUNITY): Payer: Self-pay | Admitting: Emergency Medicine

## 2019-07-26 DIAGNOSIS — M79675 Pain in left toe(s): Secondary | ICD-10-CM | POA: Diagnosis not present

## 2019-07-26 DIAGNOSIS — B351 Tinea unguium: Secondary | ICD-10-CM | POA: Diagnosis not present

## 2019-07-26 DIAGNOSIS — M79674 Pain in right toe(s): Secondary | ICD-10-CM

## 2019-07-26 MED ORDER — CEPHALEXIN 500 MG PO CAPS: 500.0000 mg | ORAL_CAPSULE | Freq: Four times a day (QID) | ORAL | 0 refills | Status: AC

## 2019-07-26 NOTE — Telephone Encounter (Signed)
Lost Rx from visit, refilling Keflex

## 2019-07-26 NOTE — Progress Notes (Signed)
Subjective: Jose Woods is seen today for follow up painful, elongated, thickened toenails 1-5 b/l feet that he cannot cut. Pain interferes with daily activities. Aggravating factor includes wearing enclosed shoe gear and relieved with periodic debridement.  Current Outpatient Medications on File Prior to Visit  Medication Sig  . Amphetamine-Dextroamphetamine (ADDERALL PO) Take by mouth.  . cephALEXin (KEFLEX) 500 MG capsule Take 1 capsule (500 mg total) by mouth 2 (two) times daily.  . diclofenac (VOLTAREN) 75 MG EC tablet Take 1 tablet (75 mg total) by mouth 2 (two) times daily as needed.  Marland Kitchen ketorolac (ACULAR) 0.5 % ophthalmic solution Place 1 drop into the right eye 4 (four) times daily.  . sertraline (ZOLOFT) 100 MG tablet Take 100 mg by mouth daily.   No current facility-administered medications on file prior to visit.      No Known Allergies   Objective:  Vascular Examination: Capillary refill time <3 seconds b/l.  Dorsalis pedis present b/l.  Posterior tibial pulses present b/l.  Digital hair present x 10 digits.  Skin temperature gradient WNL b/l.   Dermatological Examination: Skin thin and atrophic b/l.  Toenails 1-5 b/l discolored, thick, dystrophic with subungual debris and pain with palpation to nailbeds due to thickness of nails.  Musculoskeletal: Muscle strength 5/5 to all LE muscle groups.  Pes planus foot deformity b/l.   Limb length discrepancy RLE 1/2 inch shorter than LLE.  Rigid subtalar joint right lower extremity. Rigid midtarsal joint right lower extremity.   Hammertoes noted right lower extremity.   No pain, crepitus or joint limitation noted with ROM.   Neurological Examination: Protective sensation intact with 10 gram monofilament bilaterally.  Epicritic sensation present bilaterally.  Vibratory sensation intact bilaterally.   Assessment: Painful onychomycosis toenails 1-5 b/l   Plan: 1. Toenails 1-5 b/l were debrided in  length and girth without iatrogenic bleeding. 2. Patient to continue soft, supportive shoe gear. 3. Patient to report any pedal injuries to medical professional immediately. 4. Follow up 3 months.  5. Patient/POA to call should there be a concern in the interim.

## 2019-07-27 LAB — AEROBIC CULTURE  (SUPERFICIAL SPECIMEN)

## 2019-07-27 LAB — AEROBIC CULTURE W GRAM STAIN (SUPERFICIAL SPECIMEN)

## 2019-07-27 LAB — AEROBIC CULTURE? (SUPERFICIAL SPECIMEN)

## 2019-07-28 ENCOUNTER — Telehealth: Payer: Self-pay | Admitting: Family Medicine

## 2019-07-28 NOTE — Telephone Encounter (Signed)
Informed of results.   Rosemarie Ax, MD [facility] 07/28/2019, 10:04 AM

## 2019-10-26 ENCOUNTER — Ambulatory Visit: Payer: Medicaid Other | Admitting: Podiatry

## 2020-02-09 ENCOUNTER — Other Ambulatory Visit: Payer: Self-pay

## 2020-02-09 DIAGNOSIS — M79674 Pain in right toe(s): Secondary | ICD-10-CM | POA: Insufficient documentation

## 2020-02-09 DIAGNOSIS — Z5321 Procedure and treatment not carried out due to patient leaving prior to being seen by health care provider: Secondary | ICD-10-CM | POA: Diagnosis not present

## 2020-02-09 NOTE — ED Triage Notes (Signed)
Pt in with co right great toe pain states thinks it may be ingrown toe nail. Pt states started a few months ago and now swelling and pain to toe has become worse.

## 2020-02-10 ENCOUNTER — Emergency Department
Admission: EM | Admit: 2020-02-10 | Discharge: 2020-02-10 | Discharge status: Against Medical Advice | Payer: Medicaid Other | Attending: Emergency Medicine | Admitting: Emergency Medicine

## 2020-02-10 ENCOUNTER — Ambulatory Visit (HOSPITAL_COMMUNITY)
Admission: EM | Admit: 2020-02-10 | Discharge: 2020-02-10 | Disposition: A | Discharge status: Home / Self Care | Payer: Medicaid Other

## 2020-02-10 ENCOUNTER — Encounter (HOSPITAL_COMMUNITY): Payer: Self-pay | Admitting: Emergency Medicine

## 2020-02-10 ENCOUNTER — Ambulatory Visit: Payer: Medicaid Other | Admitting: Podiatry

## 2020-02-10 DIAGNOSIS — L600 Ingrowing nail: Secondary | ICD-10-CM | POA: Diagnosis not present

## 2020-02-10 DIAGNOSIS — B351 Tinea unguium: Secondary | ICD-10-CM

## 2020-02-10 DIAGNOSIS — M79674 Pain in right toe(s): Secondary | ICD-10-CM

## 2020-02-10 DIAGNOSIS — M79675 Pain in left toe(s): Secondary | ICD-10-CM | POA: Diagnosis not present

## 2020-02-10 MED ORDER — CEPHALEXIN 500 MG PO CAPS: 500.0000 mg | ORAL_CAPSULE | Freq: Three times a day (TID) | ORAL | 0 refills | Status: AC

## 2020-02-10 NOTE — ED Notes (Signed)
No answer when called several times from lobby 

## 2020-02-10 NOTE — Patient Instructions (Signed)

## 2020-02-10 NOTE — Discharge Instructions (Addendum)
Follow up with [facility] for further management. Call them today  [phone]

## 2020-02-10 NOTE — ED Provider Notes (Signed)
[facility]    CSN: [id number] Arrival date & time: 02/10/20  1914      History   Chief Complaint Chief Complaint  Patient presents with  . Toe Pain    HPI Jose Woods is a 34 y.o. male.   Patient is a 34 year old male who presents today with left great toe pain.  This is been constant worsening over the past 4 days.  Patient with history of onychomycosis and sees podiatry regularly for management of his toenails.  He does have been very long he has been unable to cut them.  Pain is worse when he wears his shoes and socks.  No injuries to the foot.  No open wounds or drainage.  No history of diabetes.  ROS per HPI      Past Medical History:  Diagnosis Date  . ADHD (attention deficit hyperactivity disorder)   . Arthritis of right subtalar joint 2016  . Behavior problem   . Flat foot 2016  . Lower limb length difference 2016    Patient Active Problem List   Diagnosis Date Noted  . Lower limb length difference 01/31/2019  . Arthritis of right subtalar joint 01/31/2019  . Flat foot 01/31/2019  . Pain due to onychomycosis of toenails of both feet 01/31/2019    Past Surgical History:  Procedure Laterality Date  . RT FOOT SURGERY         Home Medications    Prior to Admission medications   Medication Sig Start Date End Date Taking? Authorizing Provider  Amphetamine-Dextroamphetamine (ADDERALL PO) Take by mouth.   Yes [provider]  diclofenac (VOLTAREN) 75 MG EC tablet Take 1 tablet (75 mg total) by mouth 2 (two) times daily as needed. 11/25/13  Yes Rodolph Bong, MD  ketorolac (ACULAR) 0.5 % ophthalmic solution Place 1 drop into the right eye 4 (four) times daily. 12/15/15  Yes Cuthriell, Delorise Royals, PA-C  sertraline (ZOLOFT) 100 MG tablet Take 100 mg by mouth daily. 04/30/19  Yes [provider]  cephALEXin (KEFLEX) 500 MG capsule Take 1 capsule (500 mg total) by mouth 3 (three) times daily. 02/10/20   Vivi Barrack, DPM      Family History History reviewed. No pertinent family history.  Social History Social History   Tobacco Use  . Smoking status: Never Smoker  . Smokeless tobacco: Never Used  Substance Use Topics  . Alcohol use: No  . Drug use: No     Allergies   Patient has no known allergies.   Review of Systems Review of Systems   Physical Exam Triage Vital Signs ED Triage Vitals  Enc Vitals Group     BP 02/10/20 0829 130/79     Pulse Rate 02/10/20 0829 88     Resp 02/10/20 0829 16     Temp 02/10/20 0829 98.4 F (36.9 C)     Temp Source 02/10/20 0829 Oral     SpO2 02/10/20 0829 100 %     Weight --      Height --      Head Circumference --      Peak Flow --      Pain Score 02/10/20 0839 10     Pain Loc --      Pain Edu? --      Excl. in GC? --    No data found.  Updated Vital Signs BP 130/79 (BP Location: Left Arm)   Pulse 88   Temp 98.4 F (36.9 C) (  Oral)   Resp 16   SpO2 100%   Visual Acuity Right Eye Distance:   Left Eye Distance:   Bilateral Distance:    Right Eye Near:   Left Eye Near:    Bilateral Near:     Physical Exam Vitals and nursing note reviewed.  Constitutional:      Appearance: Normal appearance.  HENT:     Head: Normocephalic and atraumatic.     Nose: Nose normal.  Eyes:     Conjunctiva/sclera: Conjunctivae normal.  Pulmonary:     Effort: Pulmonary effort is normal.  Musculoskeletal:        General: Normal range of motion.     Cervical back: Normal range of motion.  Feet:     Left foot:     Toenail Condition: Left toenails are abnormally thick and long. Fungal disease present.    Comments: Onychomycosis and long overgrown toenails to right foot Tender to palpation of the right great toe over the toenail.  No pain in the joint space, redness Skin:    General: Skin is warm and dry.  Neurological:     Mental Status: He is alert.  Psychiatric:        Mood and Affect: Mood normal.      UC Treatments / Results  Labs (all  labs ordered are listed, but only abnormal results are displayed) Labs Reviewed - No data to display  EKG   Radiology No results found.  Procedures Procedures (including critical care time)  Medications Ordered in UC Medications - No data to display  Initial Impression / Assessment and Plan / UC Course  I have reviewed the triage vital signs and the nursing notes.  Pertinent labs & imaging results that were available during my care of the patient were reviewed by me and considered in my medical decision making (see chart for details).     Onychomycosis. This is a chronic condition for him.  He sees podiatry regularly for this Will have him follow-up with his podiatrist for further management Final Clinical Impressions(s) / UC Diagnoses   Final diagnoses:  Onychomycosis     Discharge Instructions     [facility] for [facility] management. Call them today  [phone]     ED Prescriptions    None     PDMP not reviewed this encounter.   Orvan July, NP 02/10/20 1115

## 2020-02-10 NOTE — ED Triage Notes (Signed)
Pt c/o left great toe pain x 4 days. Pt states all of the toes are bothering him but mainly the big toe. Pt states it is uncomfortable to wear socks and shoes. Pt states he tried to clip his toenails but was unable to get thru due to pain.

## 2020-02-14 NOTE — Progress Notes (Signed)
Subjective: 34 year old male presents the office today for concerns of thick, discolored toenails that he cannot trim himself.  Particular his right big toenail become very tender and he actually to the urgent care this morning and was referred to follow-up with [place].  He denies any drainage or pus only discomfort. Denies any systemic complaints such as fevers, chills, nausea, vomiting. No acute changes since last appointment, and no other complaints at this time.   Objective: AAO x3, NAD DP/PT pulses palpable bilaterally, CRT less than 3 seconds Nails are significantly hypertrophic, dystrophic with brown discoloration.  There is tenderness nails 1-5 bilaterally in particular the right hallux toenails and is tender and is loose with underlying nail bed only attached on the proximal aspect.  Ingrowing along the medial lateral nail borders.  There is no surrounding erythema, ascending cellulitis.  No fluctuance or crepitation peer there is no malodor.  No open lesions or pre-ulcerative lesions.  No pain with calf compression, swelling, warmth, erythema  Assessment: Right hallux onycholysis, ingrown toenail; symptomatic nychomycosis   Plan: -All treatment options discussed with the patient including all alternatives, risks, complications.  -Nails debris x9 without any complications or bleeding. -At this time, the patient is requesting total nail removal with chemical matricectomy to the symptomatic portion of the nail. Risks and complications were discussed with the patient for which they understand and written consent was obtained. Under sterile conditions a total of 3 mL of a mixture of 2% lidocaine plain and 0.5% Marcaine plain was infiltrated in a hallux block fashion. Once anesthetized, the skin was prepped in sterile fashion. A tourniquet was then applied. Next the right hallux nail border was then sharply excised making sure to remove the entire offending nail border. Once the nails were ensured to  be removed area was debrided and the underlying skin was intact. There is no purulence identified in the procedure. Next phenol was then applied under standard conditions and copiously irrigated. Silvadene was applied. A dry sterile dressing was applied. After application of the dressing the tourniquet was removed and there is found to be an immediate capillary refill time to the digit. The patient tolerated the procedure well any complications. Post procedure instructions were discussed the patient for which he verbally understood. Follow-up in one week for nail check or sooner if any problems are to arise. Discussed signs/symptoms of infection and directed to call the office immediately should any occur or go directly to the emergency room. In the meantime, encouraged to call the office with any questions, concerns, changes symptoms. -Keflex -Patient encouraged to call the office with any questions, concerns, change in symptoms.   Vivi Barrack DPM

## 2020-02-28 ENCOUNTER — Ambulatory Visit (INDEPENDENT_AMBULATORY_CARE_PROVIDER_SITE_OTHER): Payer: Medicaid Other | Admitting: Podiatry

## 2020-02-28 ENCOUNTER — Other Ambulatory Visit: Payer: Self-pay

## 2020-02-28 DIAGNOSIS — M79674 Pain in right toe(s): Secondary | ICD-10-CM

## 2020-02-28 DIAGNOSIS — L600 Ingrowing nail: Secondary | ICD-10-CM

## 2020-02-28 NOTE — Patient Instructions (Signed)

## 2020-03-01 DIAGNOSIS — L600 Ingrowing nail: Secondary | ICD-10-CM | POA: Insufficient documentation

## 2020-03-01 NOTE — Progress Notes (Signed)
Subjective: Jose Woods is a 34 y.o.  male returns to office today for follow up evaluation after having right Hallux total nail avulsion performed. Patient has been soaking using epsom salts and applying topical antibiotic covered with bandaid daily.  Denies any redness or drainage or any swelling.  Patient denies fevers, chills, nausea, vomiting. Denies any calf pain, chest pain, SOB.   Objective:  Vitals: Reviewed  General: Well developed, nourished, in no acute distress, alert and oriented x3   Dermatology: Skin is warm, dry and supple bilateral. RIGHT hallux nail border appears to be clean, dry, with mild granular tissue and surrounding scab. There is no surrounding erythema, edema, drainage/purulence. The remaining nails appear unremarkable at this time. There are no other lesions or other signs of infection present.  Neurovascular status: Intact. No lower extremity swelling; No pain with calf compression bilateral.  Musculoskeletal: Decreased tenderness to palpation of the right hallux nail bed. Muscular strength within normal limits bilateral.   Assesement and Plan: S/p partial nail avulsion, doing well.   -Continue soaking in epsom salts twice a day followed by antibiotic ointment and a band-aid. Can leave uncovered at night. Continue this until completely healed.  -If the area has not healed in 2 weeks, call the office for follow-up appointment, or sooner if any problems arise.  -Monitor for any signs/symptoms of infection. Call the office immediately if any occur or go directly to the emergency room. Call with any questions/concerns.  Ovid Curd, DPM

## 2020-03-21 ENCOUNTER — Other Ambulatory Visit: Payer: Self-pay

## 2020-03-21 ENCOUNTER — Ambulatory Visit (INDEPENDENT_AMBULATORY_CARE_PROVIDER_SITE_OTHER): Payer: Medicaid Other | Admitting: Podiatry

## 2020-03-21 ENCOUNTER — Encounter: Payer: Self-pay | Admitting: Podiatry

## 2020-03-21 DIAGNOSIS — M79674 Pain in right toe(s): Secondary | ICD-10-CM

## 2020-03-21 DIAGNOSIS — Z9889 Other specified postprocedural states: Secondary | ICD-10-CM

## 2020-03-21 DIAGNOSIS — B351 Tinea unguium: Secondary | ICD-10-CM

## 2020-03-21 DIAGNOSIS — M79675 Pain in left toe(s): Secondary | ICD-10-CM

## 2020-03-24 NOTE — Progress Notes (Signed)
Subjective: Jose Woods is a pleasant 34 y.o. male patient seen today painful thick toenails that are difficult to trim. Pain interferes with ambulation. Aggravating factors include wearing enclosed shoe gear. Pain is relieved with periodic professional debridement.  Past Medical History:  Diagnosis Date   ADHD (attention deficit hyperactivity disorder)    Arthritis of right subtalar joint 2016   Behavior problem    Flat foot 2016   Lower limb length difference 2016    Patient Active Problem List   Diagnosis Date Noted   Ingrown toenail 03/01/2020   Lower limb length difference 01/31/2019   Arthritis of right subtalar joint 01/31/2019   Flat foot 01/31/2019   Pain due to onychomycosis of toenails of both feet 01/31/2019    Current Outpatient Medications on File Prior to Visit  Medication Sig Dispense Refill   Amphetamine-Dextroamphetamine (ADDERALL PO) Take by mouth.     cephALEXin (KEFLEX) 500 MG capsule Take 1 capsule (500 mg total) by mouth 3 (three) times daily. 21 capsule 0   diclofenac (VOLTAREN) 75 MG EC tablet Take 1 tablet (75 mg total) by mouth 2 (two) times daily as needed. 60 tablet 0   ketorolac (ACULAR) 0.5 % ophthalmic solution Place 1 drop into the right eye 4 (four) times daily. 5 mL 0   sertraline (ZOLOFT) 100 MG tablet Take 100 mg by mouth daily.     No current facility-administered medications on file prior to visit.    No Known Allergies  Objective: Physical Exam  General: Jose Woods is a pleasant 34 y.o. African American male, in NAD. AAO x 3.   Vascular:  Capillary fill time to digits <3 seconds b/l lower extremities. Palpable pedal pulses b/l LE. Pedal hair present. Lower extremity skin temperature gradient within normal limits. No pain with calf compression b/l.  Dermatological:  Pedal skin is thin shiny, atrophic b/l lower extremities. No open wounds bilaterally. No interdigital macerations bilaterally. Toenails 1-5  left, R 2nd toe, R 3rd toe, R 4th toe and R 5th toe elongated, discolored, dystrophic, thickened, and crumbly with subungual debris and tenderness to dorsal palpation. Anonychia noted R hallux. Nailbed(s) epithelialized.  Procedure site of R hallux noted to be completely healed with no erythema, no edema, no drainage, no purulence.   Musculoskeletal:  Normal muscle strength 5/5 to all lower extremity muscle groups bilaterally. No pain crepitus or joint limitation noted with ROM b/l. Hammertoes noted to the right lower extremity. Pes planus deformity noted b/l.  Limited joint ROM to the right foot at MTJ and STJ.  Limb length discrepancy RLE which is 1/2 inch shorter than LLE.  Neurological:  Protective sensation intact 5/5 intact bilaterally with 10g monofilament b/l. Vibratory sensation intact b/l. Proprioception intact bilaterally. Clonus negative b/l.  Assessment and Plan:  1. Pain due to onychomycosis of toenails of both feet   2. S/P nail surgery    -Examined patient. -No new findings. No new orders. -Toenails 1-5 left, R 2nd toe, R 3rd toe, R 4th toe and R 5th toe debrided in length and girth without iatrogenic bleeding with sterile nail nipper and dremel.  -Patient to report any pedal injuries to medical professional immediately. -Patient to continue soft, supportive shoe gear daily. -Patient/POA to call should there be question/concern in the interim.  Return in about 3 months (around 06/21/2020) for nail trim.  Freddie Breech, DPM

## 2020-04-04 ENCOUNTER — Ambulatory Visit: Payer: Medicaid Other | Admitting: Podiatry

## 2020-05-10 ENCOUNTER — Ambulatory Visit (INDEPENDENT_AMBULATORY_CARE_PROVIDER_SITE_OTHER): Payer: Medicaid Other

## 2020-05-10 ENCOUNTER — Ambulatory Visit (INDEPENDENT_AMBULATORY_CARE_PROVIDER_SITE_OTHER): Payer: Medicaid Other | Admitting: Podiatry

## 2020-05-10 ENCOUNTER — Other Ambulatory Visit: Payer: Self-pay

## 2020-05-10 ENCOUNTER — Other Ambulatory Visit: Payer: Self-pay | Admitting: Podiatry

## 2020-05-10 DIAGNOSIS — T148XXA Other injury of unspecified body region, initial encounter: Secondary | ICD-10-CM

## 2020-05-10 DIAGNOSIS — S91134A Puncture wound without foreign body of right lesser toe(s) without damage to nail, initial encounter: Secondary | ICD-10-CM

## 2020-05-10 DIAGNOSIS — M79672 Pain in left foot: Secondary | ICD-10-CM

## 2020-05-10 MED ORDER — LEVOFLOXACIN 750 MG PO TABS: 750.0000 mg | ORAL_TABLET | Freq: Every day | ORAL | 0 refills | Status: AC

## 2020-05-10 NOTE — Patient Instructions (Signed)
Puncture Wound A puncture wound is an injury that is caused by a sharp, thin object that goes through your skin. A puncture wound usually does not leave a large opening in your skin, so it may not bleed a lot. However, when you get a puncture wound, dirt or other materials (foreign bodies) can be forced into your wound and can break off inside. This increases the chance of infection, such as tetanus. There are many sharp, pointed objects that can cause puncture wounds, including teeth, nails, splinters of glass, fishhooks, and needles. Treatment may include the following steps:  Washing out the wound with a germ-free (sterile) salt-water solution.  Having surgery to open the wound and remove materials from it.  Closing the wound with stitches (sutures).  Covering the wound with antibiotic ointment and a bandage (dressing). Depending on what caused the injury, you may also need a tetanus shot or a rabies shot. Follow these instructions at home: Medicines  Take or apply over-the-counter and prescription medicines only as told by your doctor.  If you were prescribed an antibiotic medicine, take or apply it as told by your doctor. Do not stop using the antibiotic even if your condition starts to get better. Bathing  Keep the bandage dry as told by your doctor.  Do not take baths, swim, or use a hot tub until your doctor approves. Ask your doctor if you may take showers. You may only be allowed to take sponge baths. Wound care   There are many ways to close and cover a wound. For example, a wound can be closed with stitches, skin glue, or skin tape (adhesive strips). Follow instructions from your doctor about how to take care of your wound. Make sure you: ? Wash your hands with soap and water before and after you change your bandage. If you cannot use soap and water, use hand sanitizer. ? Change your bandage as told by your doctor. ? Leave stitches, skin glue, or skin tape strips in place.  They may need to stay in place for 2 weeks or longer. If tape strips get loose and curl up, you may trim the loose edges. Do not remove tape strips completely unless your doctor says it is okay.  Clean the wound as told by your doctor.  Do not scratch or pick at the wound.  Check your wound every day for signs of infection. Watch for: ? Redness, swelling, or pain. ? Fluid or blood. ? Warmth. ? Pus or a bad smell. General instructions  Raise (elevate) the injured area above the level of your heart while you are sitting or lying down.  If your puncture wound is in your foot, ask your doctor if you need to avoid putting weight on your foot and for how long. Use crutches as told by your doctor.  Keep all follow-up visits as told by your doctor. This is important. Contact a doctor if:  You got a tetanus shot and you have any of these problems at the injection site: ? Swelling. ? Very bad pain. ? Redness. ? Bleeding.  You have a fever.  Your stitches come out.  You notice a bad smell coming from your wound or your bandage.  You notice something coming out of the wound, such as wood or glass.  Medicine does not help your pain.  You have more redness, swelling, or pain at the site of your wound.  You have fluid, blood, or pus coming from your wound.  You notice   a change in the color of your skin near your wound.  You need to change the bandage often because fluid, blood, or pus is coming from the wound.  You start to have a new rash.  You start to lose feeling (have numbness) around the wound.  You have warmth around your wound. Get help right away if:  You have very bad swelling around the wound.  Your pain quickly gets worse and is very bad.  You start to get painful skin lumps.  You have a red streak going away from your wound.  The wound is on your hand or foot and you: ? Cannot move a finger or toe like normal. ? Notice that your fingers or toes look pale or  blue. Summary  A puncture wound is an injury that is caused by a sharp, thin object that goes through your skin.  Treatment may include washing out the wound, having surgery to open the wound to clean it, closing the wound, and covering the wound with a bandage.  Follow instructions from your doctor about how to take care of your wound.  Contact your doctor if you have more redness, swelling, or pain at the site of your wound.  Keep all follow-up visits as told by your doctor. This is important. This information is not intended to replace advice given to you by your health care provider. Make sure you discuss any questions you have with your health care provider. Document Revised: 03/26/2018 Document Reviewed: 03/26/2018 Elsevier Patient Education  2020 [employer]  

## 2020-05-10 NOTE — Progress Notes (Signed)
  Subjective:  Patient ID: Jose Woods, male    DOB: [birth year],  MRN: [id number]   34 y.o. male presents with a complaint that he has pain along the left fifth toe.  He stepped on a nail 3 days ago and pulled it out.  This was in the yard and went through his shoe.  He had swelling.  No bleeding noted.  Objective:  Physical Exam: warm, good capillary refill, no trophic changes or ulcerative lesions, normal DP and PT pulses and normal sensory exam. Left Foot: Pain about the fifth MTPJ.  To me there is no clear injury or exit wound anywhere on the foot.  Radiographs: X-ray of the right foot: No evidence of fracture, infection, or retained foreign body. Assessment:   1. Foot pain, left   2. Puncture wound of lesser toe of right foot w/o FB w/o damage to nail, initial encounter      Plan:  Patient was evaluated and treated and all questions answered.   -Rx for prophylactic levofloxacin x5 days sent to his pharmacy -His tetanus status is out of date.  I recommend he obtain a tetanus shot at his pharmacy, and I confirmed with his pharmacy that I do offer this. -Should be self-limiting and resolve on its own.  Recommended RICE protocol with Tylenol for pain control  Return if symptoms worsen or fail to improve.

## 2020-06-30 ENCOUNTER — Ambulatory Visit: Payer: Medicaid Other | Admitting: Podiatry

## 2020-09-04 ENCOUNTER — Ambulatory Visit: Payer: Medicaid Other | Admitting: Podiatry

## 2020-09-18 ENCOUNTER — Ambulatory Visit: Payer: Medicaid Other | Admitting: Podiatry

## 2020-10-26 ENCOUNTER — Ambulatory Visit: Payer: Medicaid Other | Admitting: Podiatry

## 2020-11-02 ENCOUNTER — Encounter: Payer: Self-pay | Admitting: Podiatry

## 2020-11-02 ENCOUNTER — Other Ambulatory Visit: Payer: Self-pay

## 2020-11-02 ENCOUNTER — Ambulatory Visit (INDEPENDENT_AMBULATORY_CARE_PROVIDER_SITE_OTHER): Payer: Medicaid Other | Admitting: Podiatry

## 2020-11-02 DIAGNOSIS — B351 Tinea unguium: Secondary | ICD-10-CM

## 2020-11-02 DIAGNOSIS — M2142 Flat foot [pes planus] (acquired), left foot: Secondary | ICD-10-CM

## 2020-11-02 DIAGNOSIS — M2141 Flat foot [pes planus] (acquired), right foot: Secondary | ICD-10-CM | POA: Diagnosis not present

## 2020-11-02 DIAGNOSIS — M79675 Pain in left toe(s): Secondary | ICD-10-CM | POA: Diagnosis not present

## 2020-11-02 DIAGNOSIS — M79674 Pain in right toe(s): Secondary | ICD-10-CM

## 2020-11-02 NOTE — Progress Notes (Signed)
This patient presents to the office with long thick nails right toes.  He also had a nail removal on great toenail  right foot by Dr.  Donzetta Matters.  It has regrown with multiple pieces of nail and is causing pain and discomfort.  It is painful walking and wearing his shoes.  He presents for nail care.  General Appearance  Alert, conversant and in no acute stress.  Vascular  Dorsalis pedis and posterior tibial  pulses are palpable  bilaterally.  Capillary return is within normal limits  bilaterally. Temperature is within normal limits  bilaterally.  Neurologic  Senn-Weinstein monofilament wire test within normal limits  bilaterally. Muscle power within normal limits bilaterally.  Nails Thick disfigured discolored nails with subungual debris  from hallux to fifth toes right foot. The hallux nails has multiple nail fragments growing back.. No evidence of bacterial infection or drainage bilaterally.  Orthopedic  No limitations of motion  feet .  No crepitus or effusions noted.  No bony pathology or digital deformities noted.  Skin  normotropic skin with no porokeratosis noted bilaterally.  No signs of infections or ulcers noted.    Onychomycosis  Right foot.  ROV.  Debride nails right foot with nail nipper followed by usage of dremel tool.   RTC 4 months  Helane Gunther DPM

## 2021-02-20 IMAGING — DX DG FINGER THUMB 2+V*L*
3 series · 3 of 3 positions shown · non-contrast
Comparison: None.

CLINICAL DATA: Crush injury.  Pain and swelling.

EXAM:
LEFT THUMB 2+V

[finger ap]
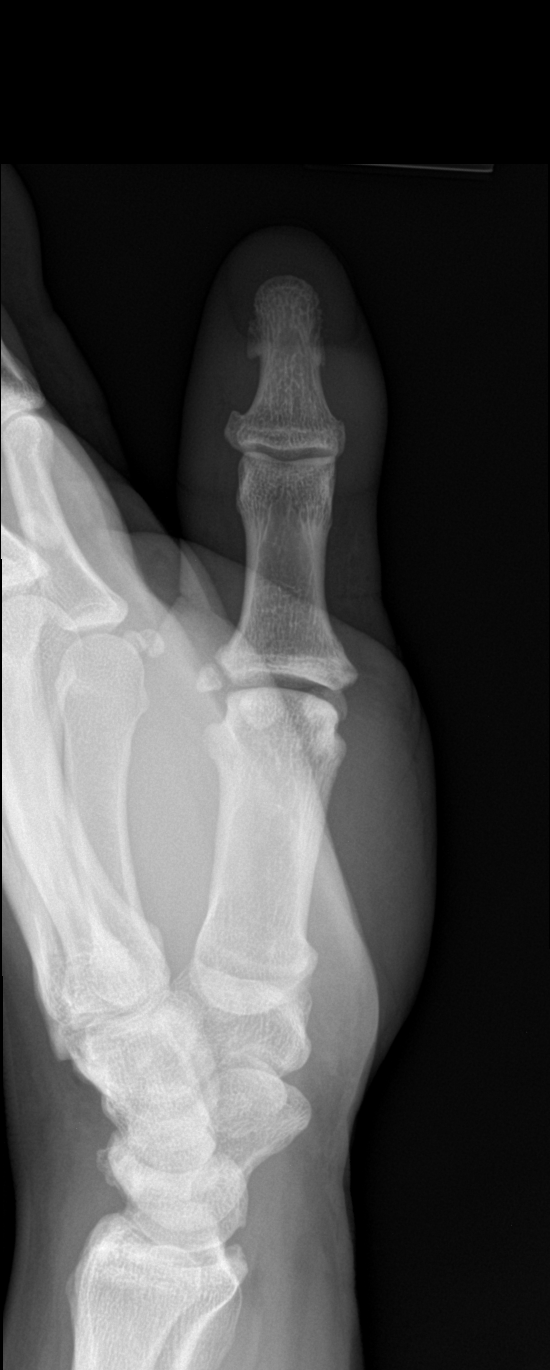

[finger obl]
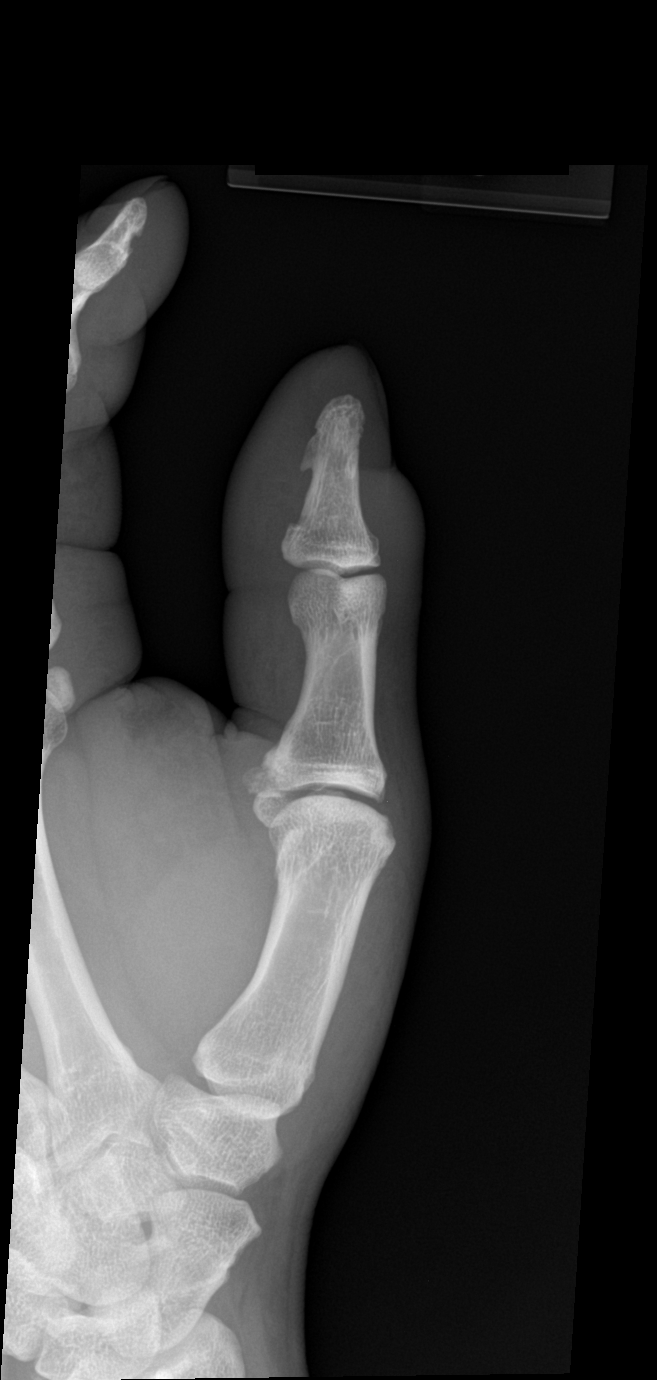

[finger lat]
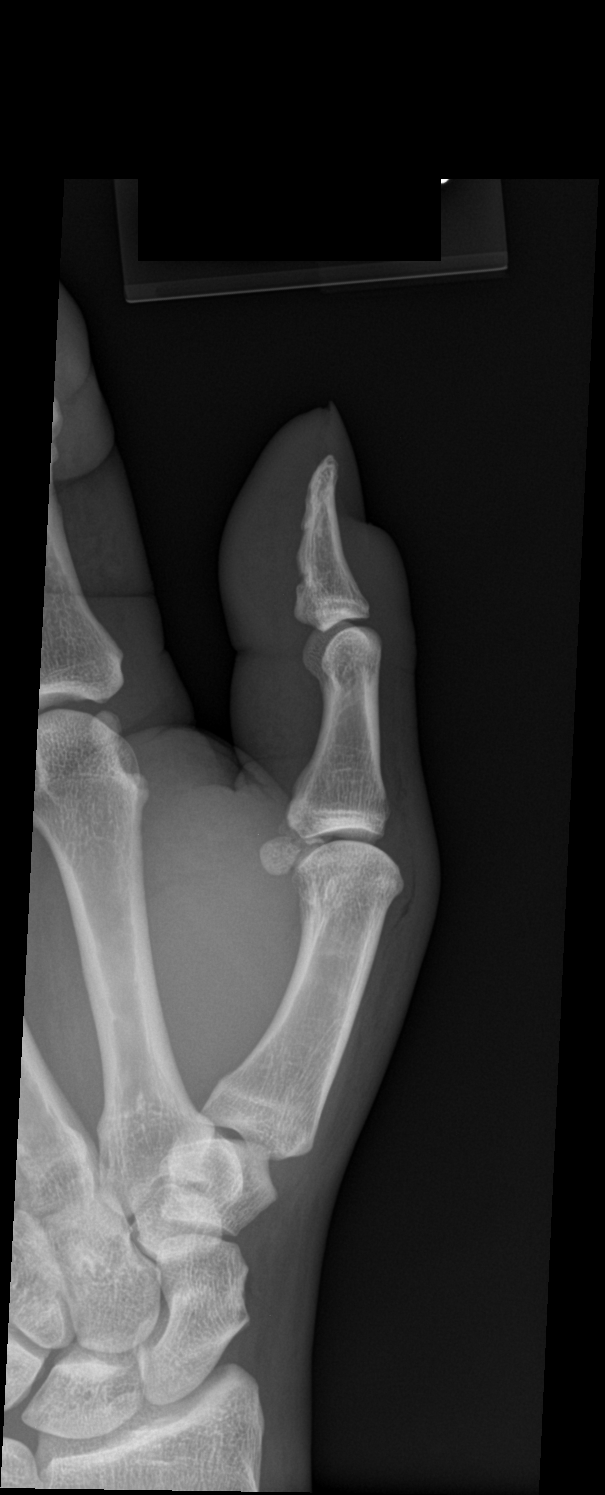

[3 of 3 positions shown; findings below may reference images not displayed]

FINDINGS: There is no evidence of fracture or dislocation. There is no
evidence of arthropathy or other focal bone abnormality. Soft tissue
swelling noted in the thumb.
IMPRESSION: No acute bony abnormality.

## 2021-03-08 ENCOUNTER — Ambulatory Visit: Payer: Medicaid Other | Admitting: Podiatry

## 2021-04-19 ENCOUNTER — Other Ambulatory Visit: Payer: Self-pay

## 2021-04-19 ENCOUNTER — Ambulatory Visit (INDEPENDENT_AMBULATORY_CARE_PROVIDER_SITE_OTHER): Payer: Medicaid Other | Admitting: Podiatry

## 2021-04-19 ENCOUNTER — Encounter: Payer: Self-pay | Admitting: Podiatry

## 2021-04-19 DIAGNOSIS — M79674 Pain in right toe(s): Secondary | ICD-10-CM | POA: Diagnosis not present

## 2021-04-19 DIAGNOSIS — M79675 Pain in left toe(s): Secondary | ICD-10-CM | POA: Diagnosis not present

## 2021-04-19 DIAGNOSIS — B351 Tinea unguium: Secondary | ICD-10-CM | POA: Diagnosis not present

## 2021-04-19 DIAGNOSIS — M2142 Flat foot [pes planus] (acquired), left foot: Secondary | ICD-10-CM

## 2021-04-19 DIAGNOSIS — M2141 Flat foot [pes planus] (acquired), right foot: Secondary | ICD-10-CM

## 2021-04-19 NOTE — Progress Notes (Signed)
This patient presents to the office with long thick nails right toes.  He also had a nail removal on great toenail  right foot by Dr.  Donzetta Matters.  It has regrown with multiple pieces of nail and is causing pain and discomfort.  The toes right foot are  painful walking and wearing his shoes.  He presents for nail care.  General Appearance  Alert, conversant and in no acute stress.  Vascular  Dorsalis pedis and posterior tibial  pulses are palpable  bilaterally.  Capillary return is within normal limits  bilaterally. Temperature is within normal limits  bilaterally.  Neurologic  Senn-Weinstein monofilament wire test within normal limits  bilaterally. Muscle power within normal limits bilaterally.  Nails Thick disfigured discolored nails with subungual debris  from hallux to fifth toes right foot. The hallux nails has multiple nail fragments growing back.. No evidence of bacterial infection or drainage bilaterally.  Orthopedic  No limitations of motion  feet .  No crepitus or effusions noted.  No bony pathology or digital deformities noted.  Skin  normotropic skin with no porokeratosis noted bilaterally.  No signs of infections or ulcers noted.    Onychomycosis  Right foot.  ROV.  Debride nails right foot with nail nipper followed by usage of dremel tool.   RTC 4 months  Helane Gunther DPM

## 2021-08-20 ENCOUNTER — Ambulatory Visit: Payer: Medicaid Other | Admitting: Podiatry

## 2021-11-05 ENCOUNTER — Ambulatory Visit: Payer: Medicaid Other | Admitting: Podiatry

## 2021-11-07 ENCOUNTER — Ambulatory Visit (INDEPENDENT_AMBULATORY_CARE_PROVIDER_SITE_OTHER): Payer: Medicaid Other | Admitting: Podiatry

## 2021-11-07 ENCOUNTER — Encounter: Payer: Self-pay | Admitting: Podiatry

## 2021-11-07 ENCOUNTER — Other Ambulatory Visit: Payer: Self-pay

## 2021-11-07 DIAGNOSIS — B351 Tinea unguium: Secondary | ICD-10-CM | POA: Diagnosis not present

## 2021-11-07 MED ORDER — TERBINAFINE HCL 250 MG PO TABS: 250.0000 mg | ORAL_TABLET | Freq: Every day | ORAL | 0 refills | Status: AC

## 2021-11-07 NOTE — Progress Notes (Signed)
?  Subjective:  ?Patient ID: Jose Woods, male    DOB: 18-May-1986,  MRN: [id number] ? ?Chief Complaint  ?Patient presents with  ? Nail Problem  ?  "Toenails cut"  ? ? ?36 y.o. male presents with the above complaint. History confirmed with patient.  Nails are thick brown discolored causing pain ? ?Objective:  ?Physical Exam: ?warm, good capillary refill, no trophic changes or ulcerative lesions, normal DP and PT pulses, and normal sensory exam. ?Left Foot: dystrophic yellowed discolored nail plates with subungual debris ?Right Foot: dystrophic yellowed discolored nail plates with subungual debris ? ? ? ? ?Assessment:  ? ?1. Onychomycosis   ? ? ? ?Plan:  ?Patient was evaluated and treated and all questions answered. ? ?I discussed the etiology and treatment options of onychomycosis in detail with the patient and his mother.  Discussed oral and topical treatment.  I recommended oral treatment Lamisil 90-day course which I sent to his pharmacy.  Photographs were taken.  The thickness and length of the nails were debrided as a courtesy.  I will see him back in 4 months for reevaluation and follow-up of his Lamisil treatment. ? ?Return in about 4 months (around 03/09/2022) for follow up after nail fungus treatment.  ? ?

## 2022-03-11 ENCOUNTER — Ambulatory Visit: Payer: Medicaid Other | Admitting: Podiatry

## 2022-03-12 ENCOUNTER — Ambulatory Visit (INDEPENDENT_AMBULATORY_CARE_PROVIDER_SITE_OTHER): Payer: Medicaid Other | Admitting: Podiatry

## 2022-03-12 ENCOUNTER — Encounter: Payer: Self-pay | Admitting: Podiatry

## 2022-03-12 DIAGNOSIS — M79674 Pain in right toe(s): Secondary | ICD-10-CM | POA: Diagnosis not present

## 2022-03-12 DIAGNOSIS — B351 Tinea unguium: Secondary | ICD-10-CM | POA: Diagnosis not present

## 2022-03-12 DIAGNOSIS — M79675 Pain in left toe(s): Secondary | ICD-10-CM | POA: Diagnosis not present

## 2022-03-12 NOTE — Progress Notes (Signed)
This patient presents to the office with long thick nails right toes.    The right big toe has multiple pieces of nail and is causing pain and discomfort.  The toes right foot are  painful walking and wearing his shoes.  He presents for nail care.  General Appearance  Alert, conversant and in no acute stress.  Vascular  Dorsalis pedis and posterior tibial  pulses are palpable  bilaterally.  Capillary return is within normal limits  bilaterally. Temperature is within normal limits  bilaterally.  Neurologic  Senn-Weinstein monofilament wire test within normal limits  bilaterally. Muscle power within normal limits bilaterally.  Nails Thick disfigured discolored nails with subungual debris  from hallux to fifth toes right foot. The hallux nails has multiple nail fragments growing back.. No evidence of bacterial infection or drainage bilaterally.  Orthopedic  No limitations of motion  feet .  No crepitus or effusions noted.  No bony pathology or digital deformities noted.  Skin  normotropic skin with no porokeratosis noted bilaterally.  No signs of infections or ulcers noted.    Onychomycosis  Right foot.  ROV.  Debride nails right foot with nail nipper followed by usage of dremel tool.   RTC 3  months  Helane Gunther DPM

## 2022-09-25 ENCOUNTER — Encounter: Payer: Self-pay | Admitting: Podiatry

## 2022-09-25 ENCOUNTER — Ambulatory Visit (INDEPENDENT_AMBULATORY_CARE_PROVIDER_SITE_OTHER): Payer: Medicaid Other | Admitting: Podiatry

## 2022-09-25 DIAGNOSIS — B351 Tinea unguium: Secondary | ICD-10-CM

## 2022-09-25 DIAGNOSIS — M2142 Flat foot [pes planus] (acquired), left foot: Secondary | ICD-10-CM

## 2022-09-25 DIAGNOSIS — M2141 Flat foot [pes planus] (acquired), right foot: Secondary | ICD-10-CM

## 2022-09-25 DIAGNOSIS — M79674 Pain in right toe(s): Secondary | ICD-10-CM

## 2022-09-25 DIAGNOSIS — M79675 Pain in left toe(s): Secondary | ICD-10-CM | POA: Diagnosis not present

## 2022-09-25 NOTE — Progress Notes (Signed)
This patient presents to the office with long thick nails right toes.    The right big toe has multiple pieces of nail and is causing pain and discomfort.  The toes right foot are  painful walking and wearing his shoes.  He presents for nail care.  General Appearance  Alert, conversant and in no acute stress.  Vascular  Dorsalis pedis and posterior tibial  pulses are palpable  bilaterally.  Capillary return is within normal limits  bilaterally. Temperature is within normal limits  bilaterally.  Neurologic  Senn-Weinstein monofilament wire test within normal limits  bilaterally. Muscle power within normal limits bilaterally.  Nails Thick disfigured discolored nails with subungual debris  from hallux to fifth toes right foot. The hallux nails has multiple nail fragments growing back.. No evidence of bacterial infection or drainage bilaterally.  Orthopedic  No limitations of motion  feet .  No crepitus or effusions noted.  No bony pathology or digital deformities noted.  Skin  normotropic skin with no porokeratosis noted bilaterally.  No signs of infections or ulcers noted.    Onychomycosis  Right foot.  ROV.  Debride nails right foot with nail nipper followed by usage of dremel tool.   RTC 6   months  Gardiner Barefoot DPM

## 2022-12-17 ENCOUNTER — Emergency Department
Admission: EM | Admit: 2022-12-17 | Discharge: 2022-12-17 | Disposition: A | Discharge status: Home / Self Care | Payer: Medicaid Other | Attending: Emergency Medicine | Admitting: Emergency Medicine

## 2022-12-17 ENCOUNTER — Encounter: Payer: Self-pay | Admitting: Intensive Care

## 2022-12-17 ENCOUNTER — Other Ambulatory Visit: Payer: Self-pay

## 2022-12-17 ENCOUNTER — Emergency Department: Payer: Medicaid Other

## 2022-12-17 DIAGNOSIS — S3992XA Unspecified injury of lower back, initial encounter: Secondary | ICD-10-CM | POA: Diagnosis present

## 2022-12-17 DIAGNOSIS — S39012A Strain of muscle, fascia and tendon of lower back, initial encounter: Secondary | ICD-10-CM | POA: Insufficient documentation

## 2022-12-17 DIAGNOSIS — Y990 Civilian activity done for income or pay: Secondary | ICD-10-CM | POA: Insufficient documentation

## 2022-12-17 DIAGNOSIS — X503XXA Overexertion from repetitive movements, initial encounter: Secondary | ICD-10-CM | POA: Diagnosis not present

## 2022-12-17 MED ORDER — BACLOFEN 10 MG PO TABS: 10.0000 mg | ORAL_TABLET | Freq: Three times a day (TID) | ORAL | 0 refills | Status: AC

## 2022-12-17 MED ORDER — MELOXICAM 15 MG PO TABS: 15.0000 mg | ORAL_TABLET | Freq: Every day | ORAL | 2 refills | Status: AC

## 2022-12-17 NOTE — ED Provider Notes (Signed)
[facility] Provider Note    Event Date/Time   First MD Initiated Contact with Patient 12/17/22 1808     (approximate)   History   Back Pain   HPI  Jose Woods is a 37 y.o. male with no significant past medical history presents emergency department with mother.  Patient states he has back pain when he bends forward.  No known injury.  States his doctor told him to come to the emergency department.  Denies numbness or tingling.  No loss of bowel or bladder control.  Symptoms started last night and patient has not taken anything other than Advil for pain      Physical Exam   Triage Vital Signs: ED Triage Vitals  Enc Vitals Group     BP 12/17/22 1506 (!) 145/86     Pulse Rate 12/17/22 1506 78     Resp 12/17/22 1506 16     Temp 12/17/22 1506 98.8 F (37.1 C)     Temp Source 12/17/22 1506 Oral     SpO2 12/17/22 1506 97 %     Weight 12/17/22 1507 160 lb (72.6 kg)     Height --      Head Circumference --      Peak Flow --      Pain Score 12/17/22 1506 10     Pain Loc --      Pain Edu? --      Excl. in GC? --     Most recent vital signs: Vitals:   12/17/22 1506 12/17/22 1830  BP: (!) 145/86 (!) 142/84  Pulse: 78 80  Resp: 16 16  Temp: 98.8 F (37.1 C) 98.8 F (37.1 C)  SpO2: 97% 100%     General: Awake, no distress.   CV:  Good peripheral perfusion. regular rate and  rhythm Resp:  Normal effort.  Abd:  No distention.   Other:  Lumbar spine nontender, little tender at the SI joint, pain is reproduced with forward flexion, patient is able to walk without difficulty, 5 or 5 strength lower extremities, neurovascular intact   ED Results / Procedures / Treatments   Labs (all labs ordered are listed, but only abnormal results are displayed) Labs Reviewed - No data to display   EKG     RADIOLOGY X-ray of the lumbar spine    PROCEDURES:   Procedures   MEDICATIONS ORDERED IN ED: Medications - No data to  display   IMPRESSION / MDM / ASSESSMENT AND PLAN / ED COURSE  I reviewed the triage vital signs and the nursing notes.                              Differential diagnosis includes, but is not limited to, lumbar strain, bulging disc, arthritis  Patient's presentation is most consistent with acute complicated illness / injury requiring diagnostic workup.   X-ray of the lumbar spine was independently reviewed and interpreted by me as being negative for any acute abnormality.  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    Patient's pain is left-sided.  I will place him on anti-inflammatory and muscle relaxer.  He is to apply ice.  I did offer a work note but he states he works for himself.  He is to follow-up with his regular doctor if not improving in 3 to 4 days.  He was discharged stable condition.  FINAL CLINICAL IMPRESSION(S) / ED DIAGNOSES   Final diagnoses:  Strain of lumbar region, initial encounter     Rx / DC Orders   ED Discharge Orders          Ordered    meloxicam (MOBIC) 15 MG tablet  Daily        12/17/22 1827    baclofen (LIORESAL) 10 MG tablet  3 times daily        12/17/22 1827             Note:  This document was prepared using Dragon voice recognition software and may include unintentional dictation errors.    Faythe Ghee, PA-C 12/17/22 Manuela Schwartz, MD 12/18/22 [phone]

## 2022-12-17 NOTE — ED Triage Notes (Signed)
Patient c/o middle, lower back pain that radiates straight down when bending over. Denies injury.   Reports lifting heavy items at work.

## 2022-12-18 ENCOUNTER — Encounter: Payer: Self-pay | Admitting: *Deleted

## 2022-12-25 ENCOUNTER — Ambulatory Visit: Payer: Medicaid Other | Admitting: Podiatry

## 2023-02-28 ENCOUNTER — Ambulatory Visit: Payer: Medicaid Other | Admitting: Podiatry

## 2023-02-28 ENCOUNTER — Encounter: Payer: Self-pay | Admitting: Podiatry

## 2023-02-28 VITALS — BP 155/90

## 2023-02-28 DIAGNOSIS — M2142 Flat foot [pes planus] (acquired), left foot: Secondary | ICD-10-CM | POA: Diagnosis not present

## 2023-02-28 DIAGNOSIS — M2141 Flat foot [pes planus] (acquired), right foot: Secondary | ICD-10-CM

## 2023-02-28 DIAGNOSIS — M79674 Pain in right toe(s): Secondary | ICD-10-CM | POA: Diagnosis not present

## 2023-02-28 DIAGNOSIS — M79675 Pain in left toe(s): Secondary | ICD-10-CM | POA: Diagnosis not present

## 2023-02-28 DIAGNOSIS — B351 Tinea unguium: Secondary | ICD-10-CM | POA: Diagnosis not present

## 2023-02-28 NOTE — Addendum Note (Signed)
Addended by: Helane Gunther on: 02/28/2023 10:08 AM   Modules accepted: Level of Service

## 2023-02-28 NOTE — Progress Notes (Addendum)
This patient presents to the office with long thick nails right toes.    The right big toe has multiple pieces of nail and is causing pain and discomfort.  The toes right foot are  painful walking and wearing his shoes.  He presents for nail care.  General Appearance  Alert, conversant and in no acute stress.  Vascular  Dorsalis pedis and posterior tibial  pulses are palpable  bilaterally.  Capillary return is within normal limits  bilaterally. Temperature is within normal limits  bilaterally.  Neurologic  Senn-Weinstein monofilament wire test within normal limits  bilaterally. Muscle power within normal limits bilaterally.  Nails Thick disfigured discolored nails with subungual debris  from hallux to fifth toes right foot. The hallux nails has multiple nail fragments growing back.. No evidence of bacterial infection or drainage bilaterally.  Orthopedic  No limitations of motion  feet .  No crepitus or effusions noted.  No bony pathology or digital deformities noted.  Skin  normotropic skin with no porokeratosis noted bilaterally.  No signs of infections or ulcers noted.    Onychomycosis  Right foot.  ROV.  Debride nails right foot with nail nipper followed by usage of dremel tool.  This patient is accompanied by his grandmother who says he needs a new brace.  Patient to see Dr.  Allena Katz about new brace.  Dr.  Irving Shows glued his AFO today.   RTC 4   months  Helane Gunther DPM

## 2023-06-02 ENCOUNTER — Ambulatory Visit: Payer: Medicaid Other | Admitting: Podiatry

## 2023-06-30 ENCOUNTER — Encounter: Payer: Self-pay | Admitting: Podiatry

## 2023-06-30 ENCOUNTER — Ambulatory Visit: Payer: Medicaid Other | Admitting: Podiatry

## 2023-06-30 DIAGNOSIS — B351 Tinea unguium: Secondary | ICD-10-CM

## 2023-06-30 DIAGNOSIS — M2142 Flat foot [pes planus] (acquired), left foot: Secondary | ICD-10-CM

## 2023-06-30 DIAGNOSIS — M2141 Flat foot [pes planus] (acquired), right foot: Secondary | ICD-10-CM

## 2023-06-30 DIAGNOSIS — M79675 Pain in left toe(s): Secondary | ICD-10-CM

## 2023-06-30 DIAGNOSIS — M79674 Pain in right toe(s): Secondary | ICD-10-CM

## 2023-06-30 NOTE — Progress Notes (Signed)
Patient and grandmother consulted RE: new arizon ankle brace  However they have MCAID and will need to be seen at [facility] or [facility]  They are setting appt. With either Dr. Logan Bores or Dr. Laurell Roof Daschel Roughton Cped, CFo, CFm

## 2023-06-30 NOTE — Progress Notes (Signed)
This patient presents to the office with long thick nails right toes.    The right big toe has multiple pieces of nail and is causing pain and discomfort.  The toes right foot are  painful walking and wearing his shoes.  He presents for nail care.  General Appearance  Alert, conversant and in no acute stress.  Vascular  Dorsalis pedis and posterior tibial  pulses are palpable  bilaterally.  Capillary return is within normal limits  bilaterally. Temperature is within normal limits  bilaterally.  Neurologic  Senn-Weinstein monofilament wire test within normal limits  bilaterally. Muscle power within normal limits bilaterally.  Nails Thick disfigured discolored nails with subungual debris  from hallux to fifth toes right foot. The hallux nails has multiple nail fragments growing back.. No evidence of bacterial infection or drainage bilaterally.  Orthopedic  No limitations of motion  feet .  No crepitus or effusions noted.  No bony pathology or digital deformities noted.  Skin  normotropic skin with no porokeratosis noted bilaterally.  No signs of infections or ulcers noted.    Onychomycosis  Right foot.  ROV.  Debride nails right foot with nail nipper followed by usage of dremel tool.  This patient is accompanied by his grandmother who says he needs a new brace.   To see Rosann Auerbach concerning his brace.   RTC 3   months  Helane Gunther DPM

## 2023-07-09 ENCOUNTER — Ambulatory Visit: Payer: Medicaid Other | Admitting: Podiatry

## 2023-09-29 ENCOUNTER — Ambulatory Visit: Payer: Medicaid Other | Admitting: Podiatry

## 2024-01-06 ENCOUNTER — Encounter: Payer: Self-pay | Admitting: Podiatry

## 2024-01-06 ENCOUNTER — Ambulatory Visit: Admitting: Podiatry

## 2024-01-06 VITALS — Ht 66.0 in | Wt 160.0 lb

## 2024-01-06 DIAGNOSIS — M2142 Flat foot [pes planus] (acquired), left foot: Secondary | ICD-10-CM | POA: Diagnosis not present

## 2024-01-06 DIAGNOSIS — M2141 Flat foot [pes planus] (acquired), right foot: Secondary | ICD-10-CM

## 2024-01-06 DIAGNOSIS — M19071 Primary osteoarthritis, right ankle and foot: Secondary | ICD-10-CM | POA: Diagnosis not present

## 2024-01-06 DIAGNOSIS — M21961 Unspecified acquired deformity of right lower leg: Secondary | ICD-10-CM

## 2024-01-07 NOTE — Progress Notes (Signed)
  Subjective:  Patient ID: Jose Woods, male    DOB: 1986-06-29,  MRN: [id number]  Chief Complaint  Patient presents with   Foot Pain    Patient is here for new brace for foot: script for [facility]    38 y.o. male presents with the above complaint. History confirmed with patient.  He returns for follow-up, he previously has utilized an [place]  brace for his chronic right foot and ankle deformity and pain which has been beneficial for him.  The brace has worn out and is no longer functional  Objective:  Physical Exam: warm, good capillary refill, no trophic changes or ulcerative lesions, normal DP and PT pulses, normal sensory exam, and painful limited motion of the subtalar joint with plantar prominence pes planus deformity.  Assessment:   1. Pes planus of both feet   2. Arthritis of right subtalar joint   3. Acquired deformity of right foot      Plan:  Patient was evaluated and treated and all questions answered.   He returns for follow-up his [place]  brace has worn out and has been previously successful in reducing his pain and improving function.  I provided a new prescription for him to get this completed at [facility] clinic for a new brace to be developed.  Follow-up with me as needed.  Return if symptoms worsen or fail to improve.

## 2024-02-12 ENCOUNTER — Ambulatory Visit: Admitting: Podiatry

## 2024-02-12 DIAGNOSIS — B351 Tinea unguium: Secondary | ICD-10-CM

## 2024-02-12 DIAGNOSIS — M79675 Pain in left toe(s): Secondary | ICD-10-CM | POA: Diagnosis not present

## 2024-02-12 DIAGNOSIS — M79674 Pain in right toe(s): Secondary | ICD-10-CM | POA: Diagnosis not present

## 2024-02-19 ENCOUNTER — Encounter: Payer: Self-pay | Admitting: Podiatry

## 2024-02-19 NOTE — Progress Notes (Signed)
  Subjective:  Patient ID: Jose Woods, male    DOB: [birth year],  MRN: [id number]  38 y.o. male presents painful elongated mycotic toenails 1-5 bilaterally which are tender when wearing enclosed shoe gear. Pain is relieved with periodic professional debridement.  New problem(s): None   PCP is Osei-Bonsu, Caretha Chapel, MD , and last visit was December 22, 2023.  No Known Allergies  Review of Systems: Negative except as noted in the HPI.   Objective:  Jose Woods is a pleasant 38 y.o. male WD, WN in NAD. AAO x 3.  Vascular Examination: Vascular status intact b/l with palpable pedal pulses. CFT immediate b/l. Pedal hair present. No edema. No pain with calf compression b/l. Skin temperature gradient WNL b/l. No varicosities noted. No cyanosis or clubbing noted.  Neurological Examination: Sensation grossly intact b/l with 10 gram monofilament.   Dermatological Examination: Pedal skin with normal turgor, texture and tone b/l. No open wounds nor interdigital macerations noted. Toenails 1-5 b/l thick, discolored, elongated with subungual debris and pain on dorsal palpation. No hyperkeratotic lesions noted b/l.   Musculoskeletal Examination: Muscle strength 5/5 to b/l LE.  No pain, crepitus noted b/l.[place]  brace RLE. Pes planus deformity noted bilateral LE.  Radiographs: None   Assessment:   1. Pain due to onychomycosis of toenails of both feet    Plan:  Patient was evaluated and treated. All patient's and/or POA's questions/concerns addressed on today's visit. Toenails 1-5 debrided in length and girth without incident. Continue soft, supportive shoe gear daily. Report any pedal injuries to medical professional. Call office if there are any questions/concerns. -Patient/POA to call should there be question/concern in the interim.  Return in about 3 months (around 05/14/2024).  Luella Sager, DPM      [facility] LOCATION: [address], [place] [address]                   Office [phone]   [facility] LOCATION: [address] [place], [place] [address] Office [phone]

## 2024-05-17 ENCOUNTER — Ambulatory Visit (INDEPENDENT_AMBULATORY_CARE_PROVIDER_SITE_OTHER): Admitting: Podiatry

## 2024-05-17 DIAGNOSIS — Z91199 Patient's noncompliance with other medical treatment and regimen due to unspecified reason: Secondary | ICD-10-CM

## 2024-05-17 NOTE — Progress Notes (Signed)
 1. No-show for appointment

## 2024-06-21 ENCOUNTER — Ambulatory Visit (INDEPENDENT_AMBULATORY_CARE_PROVIDER_SITE_OTHER): Admitting: Podiatry

## 2024-06-21 DIAGNOSIS — Z91199 Patient's noncompliance with other medical treatment and regimen due to unspecified reason: Secondary | ICD-10-CM

## 2024-06-21 NOTE — Progress Notes (Signed)
 1. No-show for appointment
